# Patient Record
Sex: Male | Born: 1960 | Hispanic: No | Marital: Single | Smoking: Current every day smoker
Health system: Southern US, Community
[De-identification: ages and names within clinical notes are randomized; demographics above are authoritative.]

## PROBLEM LIST (undated history)

## (undated) DIAGNOSIS — C801 Malignant (primary) neoplasm, unspecified: Secondary | ICD-10-CM

## (undated) DIAGNOSIS — Z8673 Personal history of transient ischemic attack (TIA), and cerebral infarction without residual deficits: Secondary | ICD-10-CM

## (undated) DIAGNOSIS — K7460 Unspecified cirrhosis of liver: Secondary | ICD-10-CM

## (undated) DIAGNOSIS — R0600 Dyspnea, unspecified: Secondary | ICD-10-CM

## (undated) HISTORY — PX: OTHER SURGICAL HISTORY: SHX169

---
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
## 2004-11-12 ENCOUNTER — Emergency Department: Payer: Self-pay | Admitting: Emergency Medicine

## 2006-12-28 ENCOUNTER — Emergency Department: Payer: Self-pay | Admitting: Emergency Medicine

## 2010-02-23 ENCOUNTER — Emergency Department: Payer: Self-pay | Admitting: Emergency Medicine

## 2016-06-12 DIAGNOSIS — Z8673 Personal history of transient ischemic attack (TIA), and cerebral infarction without residual deficits: Secondary | ICD-10-CM

## 2016-06-12 HISTORY — DX: Personal history of transient ischemic attack (TIA), and cerebral infarction without residual deficits: Z86.73

## 2018-09-25 ENCOUNTER — Encounter: Payer: Self-pay | Admitting: Emergency Medicine

## 2018-09-25 ENCOUNTER — Emergency Department: Payer: Medicaid Other

## 2018-09-25 ENCOUNTER — Other Ambulatory Visit: Payer: Self-pay

## 2018-09-25 ENCOUNTER — Inpatient Hospital Stay
Admission: EM | Admit: 2018-09-25 | Discharge: 2018-09-26 | DRG: 434 | Disposition: A | Discharge status: Home / Self Care | Payer: Medicaid Other | Attending: Internal Medicine | Admitting: Internal Medicine

## 2018-09-25 DIAGNOSIS — Z1159 Encounter for screening for other viral diseases: Secondary | ICD-10-CM

## 2018-09-25 DIAGNOSIS — R140 Abdominal distension (gaseous): Secondary | ICD-10-CM

## 2018-09-25 DIAGNOSIS — F17210 Nicotine dependence, cigarettes, uncomplicated: Secondary | ICD-10-CM | POA: Diagnosis present

## 2018-09-25 DIAGNOSIS — R188 Other ascites: Secondary | ICD-10-CM | POA: Diagnosis present

## 2018-09-25 DIAGNOSIS — K7031 Alcoholic cirrhosis of liver with ascites: Principal | ICD-10-CM | POA: Diagnosis present

## 2018-09-25 DIAGNOSIS — F1010 Alcohol abuse, uncomplicated: Secondary | ICD-10-CM | POA: Diagnosis present

## 2018-09-25 DIAGNOSIS — K7460 Unspecified cirrhosis of liver: Secondary | ICD-10-CM

## 2018-09-25 LAB — COMPREHENSIVE METABOLIC PANEL
ALT: 42 U/L (ref 0–44)
AST: 73 U/L — ABNORMAL HIGH (ref 15–41)
Albumin: 2.8 g/dL — ABNORMAL LOW (ref 3.5–5.0)
Alkaline Phosphatase: 141 U/L — ABNORMAL HIGH (ref 38–126)
Anion gap: 9 (ref 5–15)
BUN: 11 mg/dL (ref 6–20)
CO2: 22 mmol/L (ref 22–32)
Calcium: 8.4 mg/dL — ABNORMAL LOW (ref 8.9–10.3)
Chloride: 105 mmol/L (ref 98–111)
Creatinine, Ser: 0.65 mg/dL (ref 0.61–1.24)
GFR calc Af Amer: >60 mL/min (ref >60)
GFR calc non Af Amer: >60 mL/min (ref >60)
Glucose, Bld: 138 mg/dL — ABNORMAL HIGH (ref 70–99)
Potassium: 3.9 mmol/L (ref 3.5–5.1)
Sodium: 136 mmol/L (ref 135–145)
Total Bilirubin: 1.4 mg/dL — ABNORMAL HIGH (ref 0.3–1.2)
Total Protein: 6.9 g/dL (ref 6.5–8.1)

## 2018-09-25 LAB — URINALYSIS, COMPLETE (UACMP) WITH MICROSCOPIC
Bacteria, UA: NONE SEEN
Bilirubin Urine: NEGATIVE
Glucose, UA: NEGATIVE mg/dL
Hgb urine dipstick: NEGATIVE
Ketones, ur: NEGATIVE mg/dL
Leukocytes,Ua: NEGATIVE
Nitrite: NEGATIVE
Protein, ur: NEGATIVE mg/dL
Specific Gravity, Urine: 1.04 — ABNORMAL HIGH (ref 1.005–1.030)
Squamous Epithelial / HPF: NONE SEEN (ref 0–5)
pH: 5 (ref 5.0–8.0)

## 2018-09-25 LAB — CBC
HCT: 39.4 % (ref 39.0–52.0)
Hemoglobin: 13.5 g/dL (ref 13.0–17.0)
MCH: 33.1 pg (ref 26.0–34.0)
MCHC: 34.3 g/dL (ref 30.0–36.0)
MCV: 96.6 fL (ref 80.0–100.0)
Platelets: 120 10*3/uL — ABNORMAL LOW (ref 150–400)
RBC: 4.08 MIL/uL — ABNORMAL LOW (ref 4.22–5.81)
RDW: 13 % (ref 11.5–15.5)
WBC: 6.4 10*3/uL (ref 4.0–10.5)
nRBC: 0 % (ref 0.0–0.2)

## 2018-09-25 LAB — TROPONIN I (HIGH SENSITIVITY): Troponin I (High Sensitivity): 3 ng/L (ref <18)

## 2018-09-25 LAB — LIPASE, BLOOD: Lipase: 43 U/L (ref 11–51)

## 2018-09-25 LAB — BRAIN NATRIURETIC PEPTIDE: B Natriuretic Peptide: 57 pg/mL (ref 0.0–100.0)

## 2018-09-25 MED ORDER — IOHEXOL 240 MG/ML SOLN
50.0000 mL | Freq: Once | INTRAMUSCULAR | Status: AC
  Administered 2018-09-25: 50 mL via ORAL

## 2018-09-25 MED ORDER — IOHEXOL 300 MG/ML  SOLN
100.0000 mL | Freq: Once | INTRAMUSCULAR | Status: AC | PRN
  Administered 2018-09-25: 100 mL via INTRAVENOUS

## 2018-09-25 NOTE — ED Provider Notes (Addendum)
[facility] Emergency Department Provider Note   ____________________________________________   First MD Initiated Contact with Patient 09/25/18 2209     (approximate)  I have reviewed the triage vital signs and the nursing notes.   HISTORY  Chief Complaint Abdominal Pain    HPI David Brown is a 58 y.o. male who complains of abdominal tightness.  He denies any chest pain or shortness of breath.  He lays down he says and get short of breath.  He cannot lay down he is been sleeping sitting up with his feet propped up.  Symptoms have been going on for 6 weeks started rapidly and got worse.  He is not running any fever.  His not having any nausea vomiting or diarrhea although occasionally has a lot of loose stools.  The stools are mostly just mushy but not runny.         History reviewed. No pertinent past medical history.  There are no active problems to display for this patient.   History reviewed. No pertinent surgical history.  Prior to Admission medications   Not on File    Allergies Patient has no allergy information on record.  No family history on file.  Social History Social History   Tobacco Use   Smoking status: Current Every Day Smoker   Smokeless tobacco: Never Used  Substance Use Topics   Alcohol use: Yes   Drug use: Not on file    Review of Systems  Constitutional: No fever/chills although he has a temperature of 99 in the ER Eyes: No visual changes. ENT: No sore throat. Cardiovascular: Denies chest pain. Respiratory: Denies shortness of breath sitting up. Gastrointestinal: See HPI Genitourinary: Negative for dysuria. Musculoskeletal: Negative for back pain. Skin: Negative for rash. Neurological: Negative for headaches, focal weakness   ____________________________________________   PHYSICAL EXAM:  VITAL SIGNS: ED Triage Vitals  Enc Vitals Group     BP 09/25/18 1613 115/82     Pulse Rate 09/25/18  1613 (!) 104     Resp 09/25/18 1613 16     Temp 09/25/18 1613 99 F (37.2 C)     Temp Source 09/25/18 1613 Oral     SpO2 09/25/18 1613 97 %     Weight 09/25/18 1614 165 lb (74.8 kg)     Height 09/25/18 1614 5\' 6"  (1.676 m)     Head Circumference --      Peak Flow --      Pain Score 09/25/18 1618 8     Pain Loc --      Pain Edu? --      Excl. in [place]? --     Constitutional: Alert and oriented. Well appearing and in no acute distress. Eyes: Conjunctivae are normal. Head: Atraumatic. Nose: No congestion/rhinnorhea. Mouth/Throat: Mucous membranes are moist.  Oropharynx non-erythematous. Neck: No stridor.  Cardiovascular: Normal rate, regular rhythm. Grossly normal heart sounds.  Good peripheral circulation. Respiratory: Normal respiratory effort.  No retractions. Lungs CTAB. Gastrointestinal: Firm distended but not really tender.. No abdominal bruits. No CVA tenderness. Musculoskeletal: No lower extremity tenderness  Neurologic:  Normal speech and language. No gross focal neurologic deficits are appreciated.  Skin:  Skin is warm, dry and intact. No rash noted. Psychiatric: Mood and affect are normal. Speech and behavior are normal.  ____________________________________________   LABS (all labs ordered are listed, but only abnormal results are displayed)  Labs Reviewed  COMPREHENSIVE METABOLIC PANEL - Abnormal; Notable for the following components:  Result Value   Glucose, Bld 138 (*)    Calcium 8.4 (*)    Albumin 2.8 (*)    AST 73 (*)    Alkaline Phosphatase 141 (*)    Total Bilirubin 1.4 (*)    All other components within normal limits  CBC - Abnormal; Notable for the following components:   RBC 4.08 (*)    Platelets 120 (*)    All other components within normal limits  SARS CORONAVIRUS 2 (HOSPITAL ORDER, [place] [facility])  LIPASE, BLOOD  BRAIN NATRIURETIC PEPTIDE  TROPONIN I (HIGH SENSITIVITY)  URINALYSIS, COMPLETE (UACMP) WITH MICROSCOPIC    ____________________________________________  EKG   ____________________________________________  RADIOLOGY  ED MD interpretation: Rest x-ray read by radiology reviewed by me shows no active disease CT read by radiology reviewed by me shows cirrhosis and ascites. Official radiology report(s): Dg Chest 2 View  Result Date: 09/25/2018 CLINICAL DATA:  Shortness of breath EXAM: CHEST - 2 VIEW COMPARISON:  02/23/2010 FINDINGS: The heart size and mediastinal contours are within normal limits. Both lungs are clear. The visualized skeletal structures are unremarkable. IMPRESSION: No active cardiopulmonary disease. Electronically Signed   By: Inez Catalina M.D.   On: 09/25/2018 22:49   Ct Abdomen Pelvis W Contrast  Result Date: 09/25/2018 CLINICAL DATA:  Abdominal distension EXAM: CT ABDOMEN AND PELVIS WITH CONTRAST TECHNIQUE: Multidetector CT imaging of the abdomen and pelvis was performed using the standard protocol following bolus administration of intravenous contrast. CONTRAST:  146mL OMNIPAQUE IOHEXOL 300 MG/ML  SOLN COMPARISON:  None. FINDINGS: Lower chest: No acute abnormality. Hepatobiliary: Shrunken liver with nodularity consistent with underlying cirrhosis. The gallbladder is within normal limits. Pancreas: Unremarkable. No pancreatic ductal dilatation or surrounding inflammatory changes. Spleen: Normal in size without focal abnormality. Adrenals/Urinary Tract: Adrenal glands are unremarkable. Kidneys are normal, without renal calculi, focal lesion, or hydronephrosis. Bladder is unremarkable. Stomach/Bowel: The appendix is not well visualized although no inflammatory changes are identified to suggest appendicitis. Scattered large and small bowel gas is seen. No obstructive or inflammatory changes noted. Stomach is within normal limits. Vascular/Lymphatic: Aortic atherosclerosis. No enlarged abdominal or pelvic lymph nodes. Reproductive: Prostate is unremarkable. Other: Large volume ascites is  identified consistent with the underlying cirrhosis. Musculoskeletal: Degenerative changes of lumbar spine are noted. IMPRESSION: Changes consistent with underlying cirrhosis with large volume ascites. No other focal abnormality is noted. Electronically Signed   By: Inez Catalina M.D.   On: 09/25/2018 23:28    ____________________________________________   PROCEDURES  Procedure(s) performed (including Critical Care):  Procedures   ____________________________________________   INITIAL IMPRESSION / ASSESSMENT AND PLAN / ED COURSE  Patient is an a good deal of discomfort and does get short of breath when he lays down.  We will see if we can get this gentleman in the hospital on drained some of the ascites tomorrow morning.   David Brown was evaluated in Emergency Department on 09/25/2018 for the symptoms described in the history of present illness. He was evaluated in the context of the global COVID-19 pandemic, which necessitated consideration that the patient might be at risk for infection with the SARS-CoV-2 virus that causes COVID-19. Institutional protocols and algorithms that pertain to the evaluation of patients at risk for COVID-19 are in a state of rapid change based on information released by regulatory bodies including the CDC and federal and state organizations. These policies and algorithms were followed during the patient's care in the ED.  ____________________________________________   FINAL CLINICAL IMPRESSION(S) / ED DIAGNOSES  Final diagnoses:  Abdominal distention  Cirrhosis of liver with ascites, unspecified hepatic cirrhosis type [facility])     ED Discharge Orders    None       Note:  This document was prepared using Dragon voice recognition software and may include unintentional dictation errors.    Nena Polio, MD 09/25/18 2300    Nena Polio, MD 09/25/18 [phone]

## 2018-09-25 NOTE — ED Notes (Signed)
Patient transported to CT 

## 2018-09-25 NOTE — ED Triage Notes (Signed)
Pt arrives with complaints of abdominal pain that pt reports as a tightness. Pt denies current chest pain/sob. Pt does report when he lays flat back he becomes short of breath. Pt's abdomen distended and taut. Pt denies new symptoms; stating they have been present for 6 weeks "it is just getting worse."

## 2018-09-25 NOTE — ED Triage Notes (Signed)
First Nurse Note:  EMS called to home for c/o SOB.  EMS reports that abdomen has become distended over the past 6 weeks.  VS wnl.

## 2018-09-26 ENCOUNTER — Inpatient Hospital Stay: Payer: Medicaid Other

## 2018-09-26 DIAGNOSIS — R140 Abdominal distension (gaseous): Secondary | ICD-10-CM | POA: Diagnosis present

## 2018-09-26 DIAGNOSIS — R188 Other ascites: Secondary | ICD-10-CM | POA: Diagnosis present

## 2018-09-26 DIAGNOSIS — F17210 Nicotine dependence, cigarettes, uncomplicated: Secondary | ICD-10-CM | POA: Diagnosis present

## 2018-09-26 DIAGNOSIS — Z1159 Encounter for screening for other viral diseases: Secondary | ICD-10-CM | POA: Diagnosis not present

## 2018-09-26 DIAGNOSIS — K7460 Unspecified cirrhosis of liver: Secondary | ICD-10-CM

## 2018-09-26 DIAGNOSIS — K7011 Alcoholic hepatitis with ascites: Secondary | ICD-10-CM

## 2018-09-26 DIAGNOSIS — F1010 Alcohol abuse, uncomplicated: Secondary | ICD-10-CM | POA: Diagnosis present

## 2018-09-26 DIAGNOSIS — K7031 Alcoholic cirrhosis of liver with ascites: Secondary | ICD-10-CM | POA: Diagnosis present

## 2018-09-26 LAB — BODY FLUID CELL COUNT WITH DIFFERENTIAL
Lymphs, Fluid: 31 %
Monocyte-Macrophage-Serous Fluid: 63 %
Neutrophil Count, Fluid: 6 %
RBC Count-Peritoneal Lavage: 144 /mm3
Total Nucleated Cell Count, Fluid: 306 cu mm

## 2018-09-26 LAB — CREATININE, SERUM
Creatinine, Ser: 0.67 mg/dL (ref 0.61–1.24)
GFR calc Af Amer: >60 mL/min (ref >60)
GFR calc non Af Amer: >60 mL/min (ref >60)

## 2018-09-26 LAB — BASIC METABOLIC PANEL
Anion gap: 7 (ref 5–15)
BUN: 11 mg/dL (ref 6–20)
CO2: 24 mmol/L (ref 22–32)
Calcium: 7.9 mg/dL — ABNORMAL LOW (ref 8.9–10.3)
Chloride: 103 mmol/L (ref 98–111)
Creatinine, Ser: 0.62 mg/dL (ref 0.61–1.24)
GFR calc Af Amer: >60 mL/min (ref >60)
GFR calc non Af Amer: >60 mL/min (ref >60)
Glucose, Bld: 93 mg/dL (ref 70–99)
Potassium: 3.6 mmol/L (ref 3.5–5.1)
Sodium: 134 mmol/L — ABNORMAL LOW (ref 135–145)

## 2018-09-26 LAB — PROTIME-INR
INR: 1.6 — ABNORMAL HIGH (ref 0.8–1.2)
Prothrombin Time: 18.9 seconds — ABNORMAL HIGH (ref 11.4–15.2)

## 2018-09-26 LAB — BRAIN NATRIURETIC PEPTIDE: B Natriuretic Peptide: 37 pg/mL (ref 0.0–100.0)

## 2018-09-26 LAB — CBC
HCT: 39.2 % (ref 39.0–52.0)
HCT: 36.3 % — ABNORMAL LOW (ref 39.0–52.0)
Hemoglobin: 13.6 g/dL (ref 13.0–17.0)
Hemoglobin: 12.5 g/dL — ABNORMAL LOW (ref 13.0–17.0)
MCH: 33.3 pg (ref 26.0–34.0)
MCH: 33.5 pg (ref 26.0–34.0)
MCHC: 34.7 g/dL (ref 30.0–36.0)
MCHC: 34.4 g/dL (ref 30.0–36.0)
MCV: 95.8 fL (ref 80.0–100.0)
MCV: 97.3 fL (ref 80.0–100.0)
Platelets: 119 10*3/uL — ABNORMAL LOW (ref 150–400)
Platelets: 102 10*3/uL — ABNORMAL LOW (ref 150–400)
RBC: 4.09 MIL/uL — ABNORMAL LOW (ref 4.22–5.81)
RBC: 3.73 MIL/uL — ABNORMAL LOW (ref 4.22–5.81)
RDW: 13 % (ref 11.5–15.5)
RDW: 13 % (ref 11.5–15.5)
WBC: 8.9 10*3/uL (ref 4.0–10.5)
WBC: 7.2 10*3/uL (ref 4.0–10.5)
nRBC: 0 % (ref 0.0–0.2)
nRBC: 0 % (ref 0.0–0.2)

## 2018-09-26 LAB — SURGICAL PCR SCREEN
MRSA, PCR: NEGATIVE
Staphylococcus aureus: NEGATIVE

## 2018-09-26 LAB — PROTEIN, PLEURAL OR PERITONEAL FLUID: Total protein, fluid: <3 g/dL

## 2018-09-26 LAB — ALBUMIN, PLEURAL OR PERITONEAL FLUID: Albumin, Fluid: <1 g/dL

## 2018-09-26 LAB — BILIRUBIN, TOTAL: Total Bilirubin: 2.7 mg/dL — ABNORMAL HIGH (ref 0.3–1.2)

## 2018-09-26 LAB — LACTATE DEHYDROGENASE, PLEURAL OR PERITONEAL FLUID: LD, Fluid: 50 U/L — ABNORMAL HIGH (ref 3–23)

## 2018-09-26 LAB — APTT: aPTT: 36 seconds (ref 24–36)

## 2018-09-26 LAB — SARS CORONAVIRUS 2 BY RT PCR (HOSPITAL ORDER, PERFORMED IN ~~LOC~~ HOSPITAL LAB): SARS Coronavirus 2: NEGATIVE

## 2018-09-26 MED ORDER — THIAMINE HCL 100 MG/ML IJ SOLN: 100.0000 mg | Freq: Every day | INTRAMUSCULAR | Status: DC

## 2018-09-26 MED ORDER — ADULT MULTIVITAMIN W/MINERALS CH: 1.0000 | ORAL_TABLET | Freq: Every day | ORAL | 0 refills | Status: DC

## 2018-09-26 MED ORDER — ENOXAPARIN SODIUM 40 MG/0.4ML ~~LOC~~ SOLN
40.0000 mg | SUBCUTANEOUS | Status: DC
  Administered 2018-09-26: 40 mg via SUBCUTANEOUS
  Filled 2018-09-26: qty 0.4

## 2018-09-26 MED ORDER — MUPIROCIN 2 % EX OINT
1.0000 "application " | TOPICAL_OINTMENT | Freq: Two times a day (BID) | CUTANEOUS | Status: DC
  Filled 2018-09-26: qty 22

## 2018-09-26 MED ORDER — ADULT MULTIVITAMIN W/MINERALS CH: 1.0000 | ORAL_TABLET | Freq: Every day | ORAL | Status: DC

## 2018-09-26 MED ORDER — FUROSEMIDE 20 MG PO TABS
20.0000 mg | ORAL_TABLET | Freq: Every day | ORAL | Status: DC
  Administered 2018-09-26: 20 mg via ORAL
  Filled 2018-09-26: qty 1

## 2018-09-26 MED ORDER — SODIUM CHLORIDE 0.9 % IV SOLN: 250.0000 mL | INTRAVENOUS | Status: DC | PRN

## 2018-09-26 MED ORDER — VITAMIN B-1 100 MG PO TABS
100.0000 mg | ORAL_TABLET | Freq: Every day | ORAL | Status: DC
  Administered 2018-09-26: 100 mg via ORAL
  Filled 2018-09-26: qty 1

## 2018-09-26 MED ORDER — SODIUM CHLORIDE 0.9% FLUSH
3.0000 mL | Freq: Two times a day (BID) | INTRAVENOUS | Status: DC
  Administered 2018-09-26: 3 mL via INTRAVENOUS

## 2018-09-26 MED ORDER — PANTOPRAZOLE SODIUM 40 MG PO TBEC: 40.0000 mg | DELAYED_RELEASE_TABLET | Freq: Every day | ORAL | Status: DC

## 2018-09-26 MED ORDER — LORAZEPAM 2 MG/ML IJ SOLN: 1.0000 mg | Freq: Four times a day (QID) | INTRAMUSCULAR | Status: DC | PRN

## 2018-09-26 MED ORDER — ONDANSETRON HCL 4 MG PO TABS: 4.0000 mg | ORAL_TABLET | Freq: Four times a day (QID) | ORAL | Status: DC | PRN

## 2018-09-26 MED ORDER — SODIUM CHLORIDE 0.9% FLUSH: 3.0000 mL | INTRAVENOUS | Status: DC | PRN

## 2018-09-26 MED ORDER — FOLIC ACID 1 MG PO TABS
1.0000 mg | ORAL_TABLET | Freq: Every day | ORAL | Status: DC
  Administered 2018-09-26: 1 mg via ORAL
  Filled 2018-09-26: qty 1

## 2018-09-26 MED ORDER — PANTOPRAZOLE SODIUM 40 MG IV SOLR
40.0000 mg | INTRAVENOUS | Status: DC
  Administered 2018-09-26: 03:00:00 40 mg via INTRAVENOUS
  Filled 2018-09-26: qty 40

## 2018-09-26 MED ORDER — LORAZEPAM 1 MG PO TABS: 1.0000 mg | ORAL_TABLET | Freq: Four times a day (QID) | ORAL | Status: DC | PRN

## 2018-09-26 MED ORDER — ENOXAPARIN SODIUM 40 MG/0.4ML ~~LOC~~ SOLN: 40.0000 mg | SUBCUTANEOUS | Status: DC

## 2018-09-26 MED ORDER — ADULT MULTIVITAMIN W/MINERALS CH
1.0000 | ORAL_TABLET | Freq: Every day | ORAL | Status: DC
  Administered 2018-09-26: 1 via ORAL
  Filled 2018-09-26: qty 1

## 2018-09-26 MED ORDER — SPIRONOLACTONE 25 MG PO TABS
25.0000 mg | ORAL_TABLET | Freq: Every day | ORAL | Status: DC
  Administered 2018-09-26: 25 mg via ORAL
  Filled 2018-09-26: qty 1

## 2018-09-26 MED ORDER — ONDANSETRON HCL 4 MG/2ML IJ SOLN: 4.0000 mg | Freq: Four times a day (QID) | INTRAMUSCULAR | Status: DC | PRN

## 2018-09-26 MED ORDER — SPIRONOLACTONE 25 MG PO TABS: 25.0000 mg | ORAL_TABLET | Freq: Every day | ORAL | 0 refills | Status: DC

## 2018-09-26 MED ORDER — FUROSEMIDE 20 MG PO TABS: 20.0000 mg | ORAL_TABLET | Freq: Every day | ORAL | 0 refills | Status: DC

## 2018-09-26 MED ORDER — FOLIC ACID 5 MG/ML IJ SOLN: 1.0000 mg | Freq: Every day | INTRAMUSCULAR | Status: DC

## 2018-09-26 MED ORDER — FUROSEMIDE 40 MG PO TABS: 40.0000 mg | ORAL_TABLET | Freq: Every day | ORAL | Status: DC

## 2018-09-26 NOTE — TOC Transition Note (Signed)
Transition of Care [facility]) - CM/SW Discharge Note   Patient Details  Name: David Brown MRN: [id number] Date of Birth: [birth year]  [facility] CM/SW Contact:  Beverly Sessions, RN Phone Number: 09/26/2018, 2:33 PM   Clinical Narrative:    Patient to discharge today Patient was admitted with ascities.  Patient states that he lives at home with his brother and sister in law.  Sister will be picking him up for discharge.  Patient states that he relies on family to provide transportation.  Denies any issues with transportation.   Patient states that he does not have a PCP.  "Im never sick, what do I need one for".  RNCM explained the importance of having a provider.  RNCM provided application to [facility] .   Patient to be discharge with Lasix and aldactone.  Medications to be filled at [facility]  No cost to patient, will be delivered to patient at bedside.       Final next level of care: Home/Self Care     Patient Goals and CMS Choice        Discharge Placement                       Discharge Plan and Services   Discharge Planning Services: CM Consult, Medication Assistance                                 Social Determinants of Health (SDOH) Interventions     Readmission Risk Interventions No flowsheet data found.

## 2018-09-26 NOTE — ED Notes (Signed)
ED TO INPATIENT HANDOFF REPORT  ED Nurse Name and Phone #:  Berenda Morale RN [phone]  S Name/Age/Gender David Brown 58 y.o. male Room/Bed: ED24A/ED24A  Code Status   Code Status: Full Code  Home/SNF/Other Home Patient oriented to: self, place, time and situation Is this baseline? Yes   Triage Complete: Triage complete  Chief Complaint Abd Pain  EMS  Triage Note First Nurse Note:  EMS called to home for c/o SOB.  EMS reports that abdomen has become distended over the past 6 weeks.  VS wnl.  Pt arrives with complaints of abdominal pain that pt reports as a tightness. Pt denies current chest pain/sob. Pt does report when he lays flat back he becomes short of breath. Pt's abdomen distended and taut. Pt denies new symptoms; stating they have been present for 6 weeks "it is just getting worse."   Allergies No Known Allergies  Level of Care/Admitting Diagnosis ED Disposition    ED Disposition Condition [place]: [place] [100120]  Level of Care: Med-Surg [16]  Covid Evaluation: Confirmed COVID Negative  Diagnosis: Ascites [743709]  Admitting Physician: Mayer Camel [[id number]]  Attending Physician: Mayer Camel [[id number]]  Estimated length of stay: past midnight tomorrow  Certification:: I certify this patient will need inpatient services for at least 2 midnights  PT Class (Do Not Modify): Inpatient [101]  PT Acc Code (Do Not Modify): Private [1]       B Medical/Surgery History History reviewed. No pertinent past medical history. History reviewed. No pertinent surgical history.   A IV Location/Drains/Wounds Patient Lines/Drains/Airways Status   Active Line/Drains/Airways    Name:   Placement date:   Placement time:   Site:   Days:   Peripheral IV 09/25/18 Left Antecubital   09/25/18    1625    Antecubital   1          Intake/Output Last 24 hours No intake or output data in the 24 hours ending  09/26/18 0110  Labs/Imaging Results for orders placed or performed during the hospital encounter of 09/25/18 (from the past 48 hour(s))  Comprehensive metabolic panel     Status: Abnormal   Collection Time: 09/25/18  4:19 PM  Result Value Ref Range   Sodium 136 135 - 145 mmol/L   Potassium 3.9 3.5 - 5.1 mmol/L   Chloride 105 98 - 111 mmol/L   CO2 22 22 - 32 mmol/L   Glucose, Bld 138 (H) 70 - 99 mg/dL   BUN 11 6 - 20 mg/dL   Creatinine, Ser 0.65 0.61 - 1.24 mg/dL   Calcium 8.4 (L) 8.9 - 10.3 mg/dL   Total Protein 6.9 6.5 - 8.1 g/dL   Albumin 2.8 (L) 3.5 - 5.0 g/dL   AST 73 (H) 15 - 41 U/L   ALT 42 0 - 44 U/L   Alkaline Phosphatase 141 (H) 38 - 126 U/L   Total Bilirubin 1.4 (H) 0.3 - 1.2 mg/dL   GFR calc non Af Amer >60 >60 mL/min   GFR calc Af Amer >60 >60 mL/min   Anion gap 9 5 - 15    Comment: Performed at [facility], [address] [place], [place] [address]  CBC     Status: Abnormal   Collection Time: 09/25/18  4:19 PM  Result Value Ref Range   WBC 6.4 4.0 - 10.5 K/uL   RBC 4.08 (L) 4.22 - 5.81 MIL/uL   Hemoglobin 13.5 13.0 -  17.0 g/dL   HCT 39.4 39.0 - 52.0 %   MCV 96.6 80.0 - 100.0 fL   MCH 33.1 26.0 - 34.0 pg   MCHC 34.3 30.0 - 36.0 g/dL   RDW 13.0 11.5 - 15.5 %   Platelets 120 (L) 150 - 400 K/uL    Comment: Immature Platelet Fraction may be clinically indicated, consider ordering this additional test [id number]    nRBC 0.0 0.0 - 0.2 %    Comment: Performed at [facility], [address] [place], [place] [address]  Lipase, blood     Status: None   Collection Time: 09/25/18  4:19 PM  Result Value Ref Range   Lipase 43 11 - 51 U/L    Comment: Performed at [facility], [address] [place], [place] [address]  Brain natriuretic peptide     Status: None   Collection Time: 09/25/18  4:19 PM  Result Value Ref Range   B Natriuretic Peptide 57.0 0.0 - 100.0 pg/mL    Comment: Performed at [facility], [place], [place] [address]  Troponin I (High Sensitivity)     Status: None   Collection Time: 09/25/18  4:19 PM  Result Value Ref Range   Troponin I (High Sensitivity) 3 <18 ng/L    Comment: (NOTE) Elevated high sensitivity troponin I (hsTnI) values and significant  changes across serial measurements may suggest ACS but many other  chronic and acute conditions are known to elevate hsTnI results.  Refer to the "Links" section for chest pain algorithms and additional  guidance. Performed at [facility], [address] [place], [place] [address]   Urinalysis, Complete w Microscopic     Status: Abnormal   Collection Time: 09/25/18 11:32 PM  Result Value Ref Range   Color, Urine AMBER (A) YELLOW    Comment: BIOCHEMICALS MAY BE AFFECTED BY COLOR   APPearance HAZY (A) CLEAR   Specific Gravity, Urine 1.040 (H) 1.005 - 1.030   pH 5.0 5.0 - 8.0   Glucose, UA NEGATIVE NEGATIVE mg/dL   Hgb urine dipstick NEGATIVE NEGATIVE   Bilirubin Urine NEGATIVE NEGATIVE   Ketones, ur NEGATIVE NEGATIVE mg/dL   Protein, ur NEGATIVE NEGATIVE mg/dL   Nitrite NEGATIVE NEGATIVE   Leukocytes,Ua NEGATIVE NEGATIVE   WBC, UA 0-5 0 - 5 WBC/hpf   Bacteria, UA NONE SEEN NONE SEEN   Squamous Epithelial / LPF NONE SEEN 0 - 5   Mucus PRESENT     Comment: Performed at [facility], [address] [place], [place] [address]   Dg Chest 2 View  Result Date: 09/25/2018 CLINICAL DATA:  Shortness of breath EXAM: CHEST - 2 VIEW COMPARISON:  02/23/2010 FINDINGS: The heart size and mediastinal contours are within normal limits. Both lungs are clear. The visualized skeletal structures are unremarkable. IMPRESSION: No active cardiopulmonary disease. Electronically Signed   By: Inez Catalina M.D.   On: 09/25/2018 22:49   Ct Abdomen Pelvis W Contrast  Result Date: 09/25/2018 CLINICAL DATA:  Abdominal distension EXAM: CT ABDOMEN AND PELVIS WITH CONTRAST TECHNIQUE: Multidetector CT imaging of the abdomen and  pelvis was performed using the standard protocol following bolus administration of intravenous contrast. CONTRAST:  163mL OMNIPAQUE IOHEXOL 300 MG/ML  SOLN COMPARISON:  None. FINDINGS: Lower chest: No acute abnormality. Hepatobiliary: Shrunken liver with nodularity consistent with underlying cirrhosis. The gallbladder is within normal limits. Pancreas: Unremarkable. No pancreatic ductal dilatation or surrounding inflammatory changes. Spleen: Normal in size without focal abnormality.  Adrenals/Urinary Tract: Adrenal glands are unremarkable. Kidneys are normal, without renal calculi, focal lesion, or hydronephrosis. Bladder is unremarkable. Stomach/Bowel: The appendix is not well visualized although no inflammatory changes are identified to suggest appendicitis. Scattered large and small bowel gas is seen. No obstructive or inflammatory changes noted. Stomach is within normal limits. Vascular/Lymphatic: Aortic atherosclerosis. No enlarged abdominal or pelvic lymph nodes. Reproductive: Prostate is unremarkable. Other: Large volume ascites is identified consistent with the underlying cirrhosis. Musculoskeletal: Degenerative changes of lumbar spine are noted. IMPRESSION: Changes consistent with underlying cirrhosis with large volume ascites. No other focal abnormality is noted. Electronically Signed   By: Inez Catalina M.D.   On: 09/25/2018 23:28    Pending Labs Unresulted Labs (From admission, onward)    Start     Ordered   10/03/18 0500  Creatinine, serum  (enoxaparin (LOVENOX)    CrCl >/= 30 ml/min)  Weekly,   STAT    Comments: while on enoxaparin therapy    09/26/18 0055   09/26/18 1740  Basic metabolic panel  Tomorrow morning,   STAT     09/26/18 0055   09/26/18 0500  CBC  Tomorrow morning,   STAT     09/26/18 0055   09/26/18 0500  Protime-INR  Tomorrow morning,   STAT     09/26/18 0056   09/26/18 0500  APTT  Tomorrow morning,   STAT     09/26/18 0055   09/26/18 0056  HIV antibody (Routine Testing)   Once,   STAT     09/26/18 0055   09/26/18 0056  CBC  (enoxaparin (LOVENOX)    CrCl >/= 30 ml/min)  Once,   STAT    Comments: Baseline for enoxaparin therapy IF NOT ALREADY DRAWN.  Notify MD if PLT < 100 K.    09/26/18 0055   09/26/18 0056  Creatinine, serum  (enoxaparin (LOVENOX)    CrCl >/= 30 ml/min)  Once,   STAT    Comments: Baseline for enoxaparin therapy IF NOT ALREADY DRAWN.    09/26/18 0055   09/26/18 0056  Brain natriuretic peptide  Once,   STAT     09/26/18 0055   09/26/18 0056  Hepatitis panel, acute  Once,   STAT     09/26/18 0055   09/25/18 2345  SARS Coronavirus 2 (CEPHEID - Performed in [place] hospital lab), Hosp Order  (Asymptomatic Patients Labs)  Once,   STAT    Question:  Rule Out  Answer:  Yes   09/25/18 2344          Vitals/Pain Today's Vitals   09/25/18 1614 09/25/18 1618 09/25/18 2239 09/26/18 0000  BP:   (!) 154/102 (!) 126/93  Pulse:   99 98  Resp:   16 18  Temp:      TempSrc:      SpO2:   99% 93%  Weight: 74.8 kg     Height: 5\' 6"  (1.676 m)     PainSc:  8       Isolation Precautions No active isolations  Medications Medications  enoxaparin (LOVENOX) injection 40 mg (has no administration in time range)  sodium chloride flush (NS) 0.9 % injection 3 mL (has no administration in time range)  sodium chloride flush (NS) 0.9 % injection 3 mL (has no administration in time range)  sodium chloride flush (NS) 0.9 % injection 3 mL (has no administration in time range)  0.9 %  sodium chloride infusion (has no administration in time range)  ondansetron (  ZOFRAN) tablet 4 mg (has no administration in time range)    Or  ondansetron (ZOFRAN) injection 4 mg (has no administration in time range)  LORazepam (ATIVAN) tablet 1 mg (has no administration in time range)    Or  LORazepam (ATIVAN) injection 1 mg (has no administration in time range)  thiamine (VITAMIN B-1) tablet 100 mg (has no administration in time range)    Or  thiamine (B-1) injection  100 mg (has no administration in time range)  folic acid (FOLVITE) tablet 1 mg (has no administration in time range)  multivitamin with minerals tablet 1 tablet (has no administration in time range)  iohexol (OMNIPAQUE) 240 MG/ML injection 50 mL (50 mLs Oral Contrast Given 09/25/18 2245)  iohexol (OMNIPAQUE) 300 MG/ML solution 100 mL (100 mLs Intravenous Contrast Given 09/25/18 2313)    Mobility walks Low fall risk   Focused Assessments Pulmonary Assessment Handoff:  Lung sounds:   O2 Device: Room Air        R Recommendations: See Admitting Provider Note  Report given to:   Additional Notes:  Pt newly diagnosed with liver problems, has hx of etoh use.

## 2018-09-26 NOTE — Progress Notes (Signed)
Discharge order received. Patient mental status is at baseline. Vital signs stable . No signs of acute distress. Discharge instructions given. Patient verbalized understanding. No other issues noted at this time.   

## 2018-09-26 NOTE — Discharge Instructions (Signed)
Stop drinking

## 2018-09-26 NOTE — H&P (Signed)
[place] at [place] NAME: David Brown    MR#:  [id number]  DATE OF BIRTH:  Aug 15, 1960  DATE OF ADMISSION:  09/25/2018  PRIMARY CARE PHYSICIAN: No primary care provider on file.   REQUESTING/REFERRING PHYSICIAN: Conni Slipper, MD  CHIEF COMPLAINT:   Chief Complaint  Patient presents with  . Abdominal Pain    HISTORY OF PRESENT ILLNESS:  David Brown  is a 58 y.o. male with a known history of alcohol and tobacco abuse drinking 6-8 beers on most nights.  He also has a history of smoking 1 to 2 packs cigarettes per day with an 80-pack-year history of tobacco abuse.  Who presented to the emergency room complaining of shortness of breath when lying flat and abdominal distention and tightness.  He has noted the symptoms progressing over the last 6 weeks.  Patient now reports he is unable to sleep in the bed as he becomes severely short of breath.  He denies having had prior evaluation for complaints.  He denies chest pain.  He denies fevers or chills.  He denies nausea or vomiting.  He has noted loose stools however infrequent.  He denies a history of IV drug use.  He has no prior history of cirrhosis.  However on abdominal CT performed in the emergency room, there are changes consistent with underlying cirrhosis and large volume ascites.  There is no evidence of pancreatic duct dilatation or surrounding inflammatory changes.  The gallbladder is within normal limits.  Labs demonstrate bilirubin of 1.4, AST of 73, alkaline phosphatase of 141, and decreased platelet count 120.  He has been admitted to the hospitalist service for further management.  PAST MEDICAL HISTORY:  History reviewed. No pertinent past medical history.  PAST SURGICAL HISTORY:  History reviewed. No pertinent surgical history.  SOCIAL HISTORY:   Social History   Tobacco Use  . Smoking status: Current Every Day Smoker  . Smokeless tobacco: Never Used  Substance Use  Topics  . Alcohol use: Yes    FAMILY HISTORY:  No family history on file.  DRUG ALLERGIES:  No Known Allergies  REVIEW OF SYSTEMS:   Review of Systems  Constitutional: Negative for chills, diaphoresis, fever and malaise/fatigue.  HENT: Negative for congestion and sore throat.   Eyes: Negative for blurred vision and double vision.  Respiratory: Positive for shortness of breath. Negative for cough, hemoptysis, sputum production and wheezing.   Cardiovascular: Positive for orthopnea and leg swelling. Negative for chest pain and palpitations.  Gastrointestinal: Negative for abdominal pain, blood in stool, constipation, diarrhea, heartburn, melena, nausea and vomiting.  Genitourinary: Negative for dysuria, flank pain, frequency and hematuria.  Musculoskeletal: Negative for back pain, falls and joint pain.  Skin: Negative.  Negative for itching and rash.  Neurological: Negative for dizziness, tingling, tremors, loss of consciousness, weakness and headaches.  Psychiatric/Behavioral: Negative.  Negative for depression.      MEDICATIONS AT HOME:   Prior to Admission medications   Not on File      VITAL SIGNS:  Blood pressure (!) 126/93, pulse 98, temperature 99 F (37.2 C), temperature source Oral, resp. rate 18, height 5\' 6"  (1.676 m), weight 74.8 kg, SpO2 93 %.  PHYSICAL EXAMINATION:  Physical Exam  GENERAL:  58 y.o.-year-old patient lying in the bed with no acute distress.  EYES: Pupils equal, round, reactive to light and accommodation. scleral icterus present. Extraocular muscles intact.  HEENT: Head atraumatic, normocephalic. Oropharynx and nasopharynx clear.  NECK:  Supple, no jugular venous distention. No thyroid enlargement, no tenderness.  LUNGS: Normal breath sounds bilaterally, no wheezing, rales,rhonchi or crepitation. No use of accessory muscles of respiration.  CARDIOVASCULAR: Regular rate and rhythm, S1, S2 normal. No murmurs, rubs, or gallops.  ABDOMEN: Firm,  Distended, nontender. Bowel sounds present. No organomegaly or mass.  EXTREMITIES:1+ pitting pedal edema, cyanosis, or clubbing.  NEUROLOGIC: Cranial nerves II through XII are intact. Muscle strength 5/5 in all extremities. Sensation intact. Gait not checked.  PSYCHIATRIC: The patient is alert and oriented x 3.  Normal affect and good eye contact. SKIN: No obvious rash, lesion, or ulcer. jaundice  LABORATORY PANEL:   CBC Recent Labs  Lab 09/25/18 1619  WBC 6.4  HGB 13.5  HCT 39.4  PLT 120*   ------------------------------------------------------------------------------------------------------------------  Chemistries  Recent Labs  Lab 09/25/18 1619  NA 136  K 3.9  CL 105  CO2 22  GLUCOSE 138*  BUN 11  CREATININE 0.65  CALCIUM 8.4*  AST 73*  ALT 42  ALKPHOS 141*  BILITOT 1.4*   ------------------------------------------------------------------------------------------------------------------  Cardiac Enzymes No results for input(s): TROPONINI in the last 168 hours. ------------------------------------------------------------------------------------------------------------------  RADIOLOGY:  Dg Chest 2 View  Result Date: 09/25/2018 CLINICAL DATA:  Shortness of breath EXAM: CHEST - 2 VIEW COMPARISON:  02/23/2010 FINDINGS: The heart size and mediastinal contours are within normal limits. Both lungs are clear. The visualized skeletal structures are unremarkable. IMPRESSION: No active cardiopulmonary disease. Electronically Signed   By: Inez Catalina M.D.   On: 09/25/2018 22:49   Ct Abdomen Pelvis W Contrast  Result Date: 09/25/2018 CLINICAL DATA:  Abdominal distension EXAM: CT ABDOMEN AND PELVIS WITH CONTRAST TECHNIQUE: Multidetector CT imaging of the abdomen and pelvis was performed using the standard protocol following bolus administration of intravenous contrast. CONTRAST:  157mL OMNIPAQUE IOHEXOL 300 MG/ML  SOLN COMPARISON:  None. FINDINGS: Lower chest: No acute  abnormality. Hepatobiliary: Shrunken liver with nodularity consistent with underlying cirrhosis. The gallbladder is within normal limits. Pancreas: Unremarkable. No pancreatic ductal dilatation or surrounding inflammatory changes. Spleen: Normal in size without focal abnormality. Adrenals/Urinary Tract: Adrenal glands are unremarkable. Kidneys are normal, without renal calculi, focal lesion, or hydronephrosis. Bladder is unremarkable. Stomach/Bowel: The appendix is not well visualized although no inflammatory changes are identified to suggest appendicitis. Scattered large and small bowel gas is seen. No obstructive or inflammatory changes noted. Stomach is within normal limits. Vascular/Lymphatic: Aortic atherosclerosis. No enlarged abdominal or pelvic lymph nodes. Reproductive: Prostate is unremarkable. Other: Large volume ascites is identified consistent with the underlying cirrhosis. Musculoskeletal: Degenerative changes of lumbar spine are noted. IMPRESSION: Changes consistent with underlying cirrhosis with large volume ascites. No other focal abnormality is noted. Electronically Signed   By: Inez Catalina M.D.   On: 09/25/2018 23:28      IMPRESSION AND PLAN:   1. liver cirrhosis with ascites - Interventional radiology for ultrasound-guided paracentesis with labs included - Dr. Marius Ditch with gastroenterology consulted for further evaluation and recommendations - Hepatitis panel pending - Repeat BMP and CBC in the a.m.  Will monitor platelet count closely  2.  EtOH abuse - CIWA protocol  3.  Tobacco abuse - We have discussed the importance of tobacco cessation  DVT and PPI prophylaxis initiated    All the records are reviewed and case discussed with ED provider. The plan of care was discussed in details with the patient (and family). I answered all questions. The patient agreed to proceed with the above mentioned plan. Further management will depend  upon hospital course.   CODE STATUS:  Full code  TOTAL TIME TAKING CARE OF THIS PATIENT: 45 minutes.    [place] on 09/26/2018 at 1:13 AM  Pager - [phone]  After 6pm go to [email or web address] - [employer] [facility]  Office  [phone]  CC: Primary care physician; No primary care provider on file.   Note: This dictation was prepared with Dragon dictation along with smaller phrase technology. Any transcriptional errors that result from this process are unintentional.

## 2018-09-26 NOTE — Progress Notes (Addendum)
Best contact number to reach patient is [phone] (brother's cell number)   Pat( sister's work) [phone]   Pt. and sister David Brown was instructed to call [facility] tomorrow to set up follow up outpatient with Dr. Grayland Ormond.

## 2018-09-26 NOTE — Discharge Summary (Signed)
[place] at [place] NAME: David Brown    MR#:  [id number]  DATE OF BIRTH:  1960-12-11  DATE OF ADMISSION:  09/25/2018 ADMITTING PHYSICIAN: Christel Mormon, MD  DATE OF DISCHARGE: 09/26/2018  PRIMARY CARE PHYSICIAN: Patient, No Pcp Per    ADMISSION DIAGNOSIS:  Abdominal distention [R14.0] Ascites [R18.8] Cirrhosis of liver with ascites, unspecified hepatic cirrhosis type ([place]) [K74.60, R18.8]  DISCHARGE DIAGNOSIS:  *Ascites--new onset status post paracentesis *cirrhosis of the liver alcohol induced *abnormal CT scan-possible hepatocellular carcinoma-- patient will be followed at the cancer center with Dr. Grayland Ormond as outpatient workup  SECONDARY DIAGNOSIS:  History reviewed. No pertinent past medical history.  HOSPITAL COURSE:   David Brown  is a 58 y.o. male with a known history of alcohol and tobacco abuse drinking 6-8 beers on most nights.  He also has a history of smoking 1 to 2 packs cigarettes per day with an 80-pack-year history of tobacco abuse.  Who presented to the emergency room complaining of shortness of breath when lying flat and abdominal distention and tightness.    1. liver cirrhosis with ascites -patient is status post paracentesis removed 5.6 L of fluid. -Seen by Dr. Allen Norris G.I. recommends to start patient on low-dose Lasix and Aldactone. Okay from G.I. standpoint of discharge and follow-up as outpatient for further workup on cirrhosis of the liver -patient advised alcohol cessation. There is no history of DTs. -Patient otherwise hemodynamically stable  2.  EtOH abuse - CIWA protocol-- no signs of withdrawal  3.  Tobacco abuse - We have discussed the importance of tobacco cessation  4. Abnormal liver morphology noted on CT abdomen -per Dr. Pascal Lux radiology :Incidentally noted approximately 2.8 cm lesion within the subcapsular aspect the right lobe of the liver.   Further evaluation with the acquisition  of AFP level and abdominal MRI -messaged Dr. Grayland Ormond-- recommended to check AFP tumor marker CA 19- nine and follow-up as outpatient at the cancer center for further workup for possible hepatocellular carcinoma  -Patient has been explained of above. He voiced understanding.  D/c home CONSULTS OBTAINED:  Treatment Team:  Lin Landsman, MD [name], MD Lloyd Huger, MD  DRUG ALLERGIES:  No Known Allergies  DISCHARGE MEDICATIONS:   Allergies as of 09/26/2018   No Known Allergies     Medication List    TAKE these medications   furosemide 20 MG tablet Commonly known as: LASIX Take 1 tablet (20 mg total) by mouth daily.   multivitamin with minerals Tabs tablet Take 1 tablet by mouth daily. Start taking on: September 27, 2018   spironolactone 25 MG tablet Commonly known as: ALDACTONE Take 1 tablet (25 mg total) by mouth daily.       If you experience worsening of your admission symptoms, develop shortness of breath, life threatening emergency, suicidal or homicidal thoughts you must seek medical attention immediately by calling 911 or calling your MD immediately  if symptoms less severe.  You Must read complete instructions/literature along with all the possible adverse reactions/side effects for all the Medicines you take and that have been prescribed to you. Take any new Medicines after you have completely understood and accept all the possible adverse reactions/side effects.   Please note  You were cared for by a hospitalist during your hospital stay. If you have any questions about your discharge medications or the care you received while you were in the hospital after you are discharged, you can  call the unit and asked to speak with the hospitalist on call if the hospitalist that took care of you is not available. Once you are discharged, your primary care physician will handle any further medical issues. Please note that NO REFILLS for any discharge medications  will be authorized once you are discharged, as it is imperative that you return to your primary care physician (or establish a relationship with a primary care physician if you do not have one) for your aftercare needs so that they can reassess your need for medications and monitor your lab values. Today   SUBJECTIVE   No new complaints. Denies any anxiety tremors or shaking. Eating well.  VITAL SIGNS:  Blood pressure 127/71, pulse 83, temperature 98.4 F (36.9 C), temperature source Oral, resp. rate 20, height 5\' 6"  (1.676 m), weight 75.9 kg, SpO2 98 %.  I/O:  No intake or output data in the 24 hours ending 09/26/18 1219  PHYSICAL EXAMINATION:  GENERAL:  58 y.o.-year-old patient lying in the bed with no acute distress.  EYES: Pupils equal, round, reactive to light and accommodation. No scleral icterus. Extraocular muscles intact.  HEENT: Head atraumatic, normocephalic. Oropharynx and nasopharynx clear.  NECK:  Supple, no jugular venous distention. No thyroid enlargement, no tenderness.  LUNGS: Normal breath sounds bilaterally, no wheezing, rales,rhonchi or crepitation. No use of accessory muscles of respiration.  CARDIOVASCULAR: S1, S2 normal. No murmurs, rubs, or gallops.  ABDOMEN: Soft, non-tender, non-distended. Bowel sounds present. No organomegaly or mass.  EXTREMITIES: No pedal edema, cyanosis, or clubbing.  NEUROLOGIC: Cranial nerves II through XII are intact. Muscle strength 5/5 in all extremities. Sensation intact. Gait not checked.  PSYCHIATRIC: The patient is alert and oriented x 3.  SKIN: No obvious rash, lesion, or ulcer.   DATA REVIEW:   CBC  Recent Labs  Lab 09/26/18 0530  WBC 7.2  HGB 12.5*  HCT 36.3*  PLT 102*    Chemistries  Recent Labs  Lab 09/25/18 1619  09/26/18 0530  NA 136  --  134*  K 3.9  --  3.6  CL 105  --  103  CO2 22  --  24  GLUCOSE 138*  --  93  BUN 11  --  11  CREATININE 0.65   < > 0.62  CALCIUM 8.4*  --  7.9*  AST 73*  --   --    ALT 42  --   --   ALKPHOS 141*  --   --   BILITOT 1.4*  --  2.7*   < > = values in this interval not displayed.    Microbiology Results   Recent Results (from the past 240 hour(s))  SARS Coronavirus 2 (CEPHEID - Performed in [facility] hospital lab), Hosp Order     Status: None   Collection Time: 09/26/18 12:27 AM   Specimen: Nasopharyngeal Swab  Result Value Ref Range Status   SARS Coronavirus 2 NEGATIVE NEGATIVE Final    Comment: (NOTE) If result is NEGATIVE SARS-CoV-2 target nucleic acids are NOT DETECTED. The SARS-CoV-2 RNA is generally detectable in upper and lower  respiratory specimens during the acute phase of infection. The lowest  concentration of SARS-CoV-2 viral copies this assay can detect is 250  copies / mL. A negative result does not preclude SARS-CoV-2 infection  and should not be used as the sole basis for treatment or other  patient management decisions.  A negative result may occur with  improper specimen collection / handling, submission of  specimen other  than nasopharyngeal swab, presence of viral mutation(s) within the  areas targeted by this assay, and inadequate number of viral copies  (<250 copies / mL). A negative result must be combined with clinical  observations, patient history, and epidemiological information. If result is POSITIVE SARS-CoV-2 target nucleic acids are DETECTED. The SARS-CoV-2 RNA is generally detectable in upper and lower  respiratory specimens dur ing the acute phase of infection.  Positive  results are indicative of active infection with SARS-CoV-2.  Clinical  correlation with patient history and other diagnostic information is  necessary to determine patient infection status.  Positive results do  not rule out bacterial infection or co-infection with other viruses. If result is PRESUMPTIVE POSTIVE SARS-CoV-2 nucleic acids MAY BE PRESENT.   A presumptive positive result was obtained on the submitted specimen  and confirmed  on repeat testing.  While 2019 novel coronavirus  (SARS-CoV-2) nucleic acids may be present in the submitted sample  additional confirmatory testing may be necessary for epidemiological  and / or clinical management purposes  to differentiate between  SARS-CoV-2 and other Sarbecovirus currently known to infect humans.  If clinically indicated additional testing with an alternate test  methodology [phone]) is advised. The SARS-CoV-2 RNA is generally  detectable in upper and lower respiratory sp ecimens during the acute  phase of infection. The expected result is Negative. Fact Sheet for Patients:  StrictlyIdeas.no Fact Sheet for Healthcare Providers: BankingDealers.co.za This test is not yet approved or cleared by the [place] FDA and has been authorized for detection and/or diagnosis of SARS-CoV-2 by FDA under an Emergency Use Authorization (EUA).  This EUA will remain in effect (meaning this test can be used) for the duration of the COVID-19 declaration under Section 564(b)(1) of the Act, 21 U.S.C. section 360bbb-3(b)(1), unless the authorization is terminated or revoked sooner. Performed at [facility], [address], [place], [place] [address]   Surgical PCR screen     Status: None   Collection Time: 09/26/18  2:45 AM   Specimen: Nasal Mucosa; Nasal Swab  Result Value Ref Range Status   MRSA, PCR NEGATIVE NEGATIVE Final   Staphylococcus aureus NEGATIVE NEGATIVE Final    Comment: (NOTE) The Xpert SA Assay (FDA approved for NASAL specimens in patients 51 years of age and older), is one component of a comprehensive surveillance program. It is not intended to diagnose infection nor to guide or monitor treatment. Performed at [facility], [address] [place], [place] [address]     RADIOLOGY:  Dg Chest 2 View  Result Date: 09/25/2018 CLINICAL DATA:  Shortness of breath EXAM: CHEST - 2 VIEW  COMPARISON:  02/23/2010 FINDINGS: The heart size and mediastinal contours are within normal limits. Both lungs are clear. The visualized skeletal structures are unremarkable. IMPRESSION: No active cardiopulmonary disease. Electronically Signed   By: Inez Catalina M.D.   On: 09/25/2018 22:49   Ct Abdomen Pelvis W Contrast  Addendum Date: 09/26/2018   ADDENDUM REPORT: 09/26/2018 12:04 ADDENDUM: Review of CT scan of the abdomen and pelvis performed prior to ultrasound-guided paracentesis, demonstrates an approximately 2.4 x 2.4 cm hypoattenuating lesion within the caudal aspect the right lobe of the liver (image 9, series 7) as well as a questionable approximately 3.8 cm lesion within the dome of the anterior segment of the right lobe of the liver (image 18, series 2). Further evaluation with the acquisition of AFP level and abdominal MRI could be performed as clinically indicated Above findings  discussed with Dr. Posey Pronto on 09/26/2018 at 11:58. Electronically Signed   By: Sandi Mariscal M.D.   On: 09/26/2018 12:04   Result Date: 09/26/2018 CLINICAL DATA:  Abdominal distension EXAM: CT ABDOMEN AND PELVIS WITH CONTRAST TECHNIQUE: Multidetector CT imaging of the abdomen and pelvis was performed using the standard protocol following bolus administration of intravenous contrast. CONTRAST:  184mL OMNIPAQUE IOHEXOL 300 MG/ML  SOLN COMPARISON:  None. FINDINGS: Lower chest: No acute abnormality. Hepatobiliary: Shrunken liver with nodularity consistent with underlying cirrhosis. The gallbladder is within normal limits. Pancreas: Unremarkable. No pancreatic ductal dilatation or surrounding inflammatory changes. Spleen: Normal in size without focal abnormality. Adrenals/Urinary Tract: Adrenal glands are unremarkable. Kidneys are normal, without renal calculi, focal lesion, or hydronephrosis. Bladder is unremarkable. Stomach/Bowel: The appendix is not well visualized although no inflammatory changes are identified to suggest  appendicitis. Scattered large and small bowel gas is seen. No obstructive or inflammatory changes noted. Stomach is within normal limits. Vascular/Lymphatic: Aortic atherosclerosis. No enlarged abdominal or pelvic lymph nodes. Reproductive: Prostate is unremarkable. Other: Large volume ascites is identified consistent with the underlying cirrhosis. Musculoskeletal: Degenerative changes of lumbar spine are noted. IMPRESSION: Changes consistent with underlying cirrhosis with large volume ascites. No other focal abnormality is noted. Electronically Signed: By: Inez Catalina M.D. On: 09/25/2018 23:28   US Paracentesis  Result Date: 09/26/2018 INDICATION: History of cirrhosis, now with symptomatic intra-abdominal ascites. Please perform ultrasound-guided paracentesis for diagnostic purposes. EXAM: ULTRASOUND-GUIDED PARACENTESIS COMPARISON:  CT abdomen and pelvis-09/25/2018 MEDICATIONS: None. COMPLICATIONS: None immediate. TECHNIQUE: Informed written consent was obtained from the patient after a discussion of the risks, benefits and alternatives to treatment. A timeout was performed prior to the initiation of the procedure. Initial ultrasound scanning demonstrates a moderate amount of ascites within the right lower abdominal quadrant. Limited sonographic evaluation performed by the dictating interventional radiologist demonstrates an approximately 2.8 x 2.4 cm lesion within the subcapsular aspect the right lobe of the liver (image 9). The right lower abdomen was prepped and draped in the usual sterile fashion. 1% lidocaine with epinephrine was used for local anesthesia. An ultrasound image was saved for documentation purposed. An 8 Fr Safe-T-Centesis catheter was introduced. The paracentesis was performed. The catheter was removed and a dressing was applied. The patient tolerated the procedure well without immediate post procedural complication. FINDINGS: A total of approximately 5.6 liters of serous fluid was removed.  Samples were sent to the laboratory as requested by the clinical team. IMPRESSION: 1. Successful ultrasound-guided paracentesis yielding 5.6 liters of peritoneal fluid. 2. Incidentally noted approximately 2.8 cm lesion within the subcapsular aspect the right lobe of the liver. Further evaluation with the acquisition of AFP level and abdominal MRI could be performed as indicated. Above findings discussed with Dr. Posey Pronto at the time procedure completion. Electronically Signed   By: Sandi Mariscal M.D.   On: 09/26/2018 11:58     CODE STATUS:     Code Status Orders  (From admission, onward)         Start     Ordered   09/26/18 0056  Full code  Continuous     09/26/18 0055        Code Status History    This patient has a current code status but no historical code status.   Advance Care Planning Activity      TOTAL TIME TAKING CARE OF THIS PATIENT: *40* minutes.    [name] M.D on 09/26/2018 at 12:19 PM  Between 7am to 6pm -  Pager - [phone] After 6pm go to [email or web address] - password [email or web address] [facility]  Office  [phone]  CC: Primary care physician; Patient, No Pcp Per

## 2018-09-26 NOTE — Progress Notes (Signed)
[name], male   DOB: 1960/12/14, 58 y.o.   MRN: [id number] left message at the cancer center appointment desk to call patient with a new appointment for evaluation of hepatocellular carcinoma with Dr. Grayland Ormond. I did leave patient's phone number for cancer center to get in touch with him.

## 2018-09-26 NOTE — Consult Note (Signed)
David Lame, MD [facility]  [address], [place], [place] [address] Phone: [phone] Fax : [phone]  Consultation  Referring Provider:     Dr. Posey Pronto Primary Care Physician:  Patient, No Pcp Per Primary Gastroenterologist:  Althia Forts         Reason for Consultation:     Ascites  Date of Admission:  09/25/2018 Date of Consultation:  09/26/2018         HPI:   David Brown is a 58 y.o. male who has extensive history of alcohol abuse and reports that he thinks he drinks approximately 32 beers a week. The patient reports that he came in because of abdominal pain.  The patient also smokes 1-2 packs of cigarettes per day.  The patient states that his abdominal pain was associated with shortness of breath and abdominal tightness.  He denies having a primary care provider nor does he report ever seeing a gastrologist in the past.  The patient had a CT scan in the emergency room which showed a large volume of ascites with cirrhosis.  The patient's liver enzymes showed: Component     Latest Ref Rng & Units 09/25/2018 09/26/2018 09/26/2018          1:58 AM  5:30 AM  Albumin     3.5 - 5.0 g/dL 2.8 (L)    AST     15 - 41 U/L 73 (H)    ALT     0 - 44 U/L 42    Alkaline Phosphatase     38 - 126 U/L 141 (H)    Total Bilirubin     0.3 - 1.2 mg/dL 1.4 (H)  2.7 (H)   The patient had a paracentesis today that was done just prior to me seeing the patient with 5.6 L of fluid removed. He now reports is feeling much better as far as his abdominal pain is concerned. On the imaging the patient was found to have a incidental 2.8 cm lesion in the right lobe of the liver suspicious for possible malignancy. On reviewing the CT scan the lesion was seen and reported as a 2.4 x 2.4cm  hypoattenuation lesion in the right lobe of the liver and a questionable 3.8 cm lesion at the dome of the right liver.  Tumor markers were sent today.  History reviewed. No pertinent past medical history.  History  reviewed. No pertinent surgical history.  Prior to Admission medications   Medication Sig Start Date End Date Taking? Authorizing Provider  furosemide (LASIX) 20 MG tablet Take 1 tablet (20 mg total) by mouth daily. 09/26/18   [name], MD  Multiple Vitamin (MULTIVITAMIN WITH MINERALS) TABS tablet Take 1 tablet by mouth daily. 09/27/18   [name], MD  spironolactone (ALDACTONE) 25 MG tablet Take 1 tablet (25 mg total) by mouth daily. 09/26/18   [name], MD    History reviewed. No pertinent family history.   Social History   Tobacco Use  . Smoking status: Current Every Day Smoker  . Smokeless tobacco: Never Used  Substance Use Topics  . Alcohol use: Yes  . Drug use: Not on file    Allergies as of 09/25/2018  . (Not on File)    Review of Systems:    All systems reviewed and negative except where noted in HPI.   Physical Exam:  Vital signs in last 24 hours: Temp:  [98 F (36.7 C)-98.4 F (36.9 C)] 98.3 F (36.8 C) (07/08 1507) Pulse Rate:  [  80-99] 84 (07/08 1507) Resp:  [16-20] 20 (07/08 0411) BP: (102-154)/(66-102) 108/85 (07/08 1507) SpO2:  [93 %-100 %] 98 % (07/08 1507) Weight:  [75.9 kg] 75.9 kg (07/08 0200) Last BM Date: 09/24/18 General:   Pleasant, cooperative in NAD Head:  Normocephalic and atraumatic. Eyes:   No icterus.   Conjunctiva pink. PERRLA. Ears:  Normal auditory acuity. Neck:  Supple; no masses or thyroidomegaly Lungs: Respirations even and unlabored. Lungs clear to auscultation bilaterally.   No wheezes, crackles, or rhonchi.  Heart:  Regular rate and rhythm;  Without murmur, clicks, rubs or gallops Abdomen:  Soft, nondistended, nontender. Normal bowel sounds. No appreciable masses or hepatomegaly.  No rebound or guarding.  Rectal:  Not performed. Msk:  Symmetrical without gross deformities.   Extremities:  Without edema, cyanosis or clubbing. Neurologic:  Alert and oriented x3;  grossly normal neurologically. Skin:  Intact without significant  lesions or rashes. Cervical Nodes:  No significant cervical adenopathy. Psych:  Alert and cooperative. Normal affect.  LAB RESULTS: Recent Labs    09/25/18 1619 09/26/18 0158 09/26/18 0530  WBC 6.4 8.9 7.2  HGB 13.5 13.6 12.5*  HCT 39.4 39.2 36.3*  PLT 120* 119* 102*   BMET Recent Labs    09/25/18 1619 09/26/18 0158 09/26/18 0530  NA 136  --  134*  K 3.9  --  3.6  CL 105  --  103  CO2 22  --  24  GLUCOSE 138*  --  93  BUN 11  --  11  CREATININE 0.65 0.67 0.62  CALCIUM 8.4*  --  7.9*   LFT Recent Labs    09/25/18 1619 09/26/18 0530  PROT 6.9  --   ALBUMIN 2.8*  --   AST 73*  --   ALT 42  --   ALKPHOS 141*  --   BILITOT 1.4* 2.7*   PT/INR Recent Labs    09/26/18 0530  LABPROT 18.9*  INR 1.6*    STUDIES: Dg Chest 2 View  Result Date: 09/25/2018 CLINICAL DATA:  Shortness of breath EXAM: CHEST - 2 VIEW COMPARISON:  02/23/2010 FINDINGS: The heart size and mediastinal contours are within normal limits. Both lungs are clear. The visualized skeletal structures are unremarkable. IMPRESSION: No active cardiopulmonary disease. Electronically Signed   By: Inez Catalina M.D.   On: 09/25/2018 22:49   Ct Abdomen Pelvis W Contrast  Addendum Date: 09/26/2018   ADDENDUM REPORT: 09/26/2018 12:04 ADDENDUM: Review of CT scan of the abdomen and pelvis performed prior to ultrasound-guided paracentesis, demonstrates an approximately 2.4 x 2.4 cm hypoattenuating lesion within the caudal aspect the right lobe of the liver (image 9, series 7) as well as a questionable approximately 3.8 cm lesion within the dome of the anterior segment of the right lobe of the liver (image 18, series 2). Further evaluation with the acquisition of AFP level and abdominal MRI could be performed as clinically indicated Above findings discussed with Dr. Posey Pronto on 09/26/2018 at 11:58. Electronically Signed   By: Sandi Mariscal M.D.   On: 09/26/2018 12:04   Result Date: 09/26/2018 CLINICAL DATA:  Abdominal distension  EXAM: CT ABDOMEN AND PELVIS WITH CONTRAST TECHNIQUE: Multidetector CT imaging of the abdomen and pelvis was performed using the standard protocol following bolus administration of intravenous contrast. CONTRAST:  15mL OMNIPAQUE IOHEXOL 300 MG/ML  SOLN COMPARISON:  None. FINDINGS: Lower chest: No acute abnormality. Hepatobiliary: Shrunken liver with nodularity consistent with underlying cirrhosis. The gallbladder is within normal limits. Pancreas: Unremarkable. No  pancreatic ductal dilatation or surrounding inflammatory changes. Spleen: Normal in size without focal abnormality. Adrenals/Urinary Tract: Adrenal glands are unremarkable. Kidneys are normal, without renal calculi, focal lesion, or hydronephrosis. Bladder is unremarkable. Stomach/Bowel: The appendix is not well visualized although no inflammatory changes are identified to suggest appendicitis. Scattered large and small bowel gas is seen. No obstructive or inflammatory changes noted. Stomach is within normal limits. Vascular/Lymphatic: Aortic atherosclerosis. No enlarged abdominal or pelvic lymph nodes. Reproductive: Prostate is unremarkable. Other: Large volume ascites is identified consistent with the underlying cirrhosis. Musculoskeletal: Degenerative changes of lumbar spine are noted. IMPRESSION: Changes consistent with underlying cirrhosis with large volume ascites. No other focal abnormality is noted. Electronically Signed: By: Inez Catalina M.D. On: 09/25/2018 23:28   US Paracentesis  Result Date: 09/26/2018 INDICATION: History of cirrhosis, now with symptomatic intra-abdominal ascites. Please perform ultrasound-guided paracentesis for diagnostic purposes. EXAM: ULTRASOUND-GUIDED PARACENTESIS COMPARISON:  CT abdomen and pelvis-09/25/2018 MEDICATIONS: None. COMPLICATIONS: None immediate. TECHNIQUE: Informed written consent was obtained from the patient after a discussion of the risks, benefits and alternatives to treatment. A timeout was performed  prior to the initiation of the procedure. Initial ultrasound scanning demonstrates a moderate amount of ascites within the right lower abdominal quadrant. Limited sonographic evaluation performed by the dictating interventional radiologist demonstrates an approximately 2.8 x 2.4 cm lesion within the subcapsular aspect the right lobe of the liver (image 9). The right lower abdomen was prepped and draped in the usual sterile fashion. 1% lidocaine with epinephrine was used for local anesthesia. An ultrasound image was saved for documentation purposed. An 8 Fr Safe-T-Centesis catheter was introduced. The paracentesis was performed. The catheter was removed and a dressing was applied. The patient tolerated the procedure well without immediate post procedural complication. FINDINGS: A total of approximately 5.6 liters of serous fluid was removed. Samples were sent to the laboratory as requested by the clinical team. IMPRESSION: 1. Successful ultrasound-guided paracentesis yielding 5.6 liters of peritoneal fluid. 2. Incidentally noted approximately 2.8 cm lesion within the subcapsular aspect the right lobe of the liver. Further evaluation with the acquisition of AFP level and abdominal MRI could be performed as indicated. Above findings discussed with Dr. Posey Pronto at the time procedure completion. Electronically Signed   By: Sandi Mariscal M.D.   On: 09/26/2018 11:58      Impression / Plan:   Assessment: Active Problems:   Ascites   DUVALL COMES is a 58 y.o. y/o male with What appears to be cirrhosis and possible liver mass is seen on CT and ultrasound.  The patient had a large-volume paracentesis and feels much better.  The patient's abdomen is soft when I saw him today.  The patient has a extensive history of alcohol abuse.  He also has labs pending for other possible causes of abnormal liver enzymes.   Plan:  The patient should be started on a low-dose of Lasix and Aldactone with adjustment of these as  his kidney function is monitored and well at works with his ascites.  The patient has been encouraged to not drink any more and he states that he is done with alcohol use.  The patient will also be set up with oncology for the liver lesions.  Nothing further to do acutely on this patient with chronic liver disease.  The patient should follow-up with me as an outpatient.  I will sign off.  Please call if any further GI concerns or questions.  We would like to thank you for the  opportunity to participate in the care of GARWOOD WENTZELL.    Thank you for involving me in the care of this patient.      LOS: 0 days   David Lame, MD  09/26/2018, 4:38 PM    Note: This dictation was prepared with Dragon dictation along with smaller phrase technology. Any transcriptional errors that result from this process are unintentional.

## 2018-09-26 NOTE — Progress Notes (Signed)
PHARMACIST - PHYSICIAN COMMUNICATION  CONCERNING: IV to Oral Route Change Policy  RECOMMENDATION: This patient is receiving pantoprazole by the intravenous route.  Based on criteria approved by the Pharmacy and Therapeutics Committee, the intravenous medication(s) is/are being converted to the equivalent oral dose form(s).   DESCRIPTION: These criteria include:  The patient is eating (either orally or via tube) and/or has been taking other orally administered medications for a least 24 hours  The patient has no evidence of active gastrointestinal bleeding or impaired GI absorption (gastrectomy, short bowel, patient on TNA or NPO).  If you have questions about this conversion, please contact the Pharmacy Department   []   [phone] )  [place], [facility] 09/26/2018 11:03 AM

## 2018-09-26 NOTE — Procedures (Signed)
Pre Procedural Dx: Symptomatic Ascites Post Procedural Dx: Same  A total of approximately 5.6 liters of serous fluid was removed.   Samples were sent to the laboratory as requested by the clinical team.  EBL: None Complications: None immediate  Incidentally noted approximately 2.8 cm lesion within the subcapsular aspect the right lobe of the liver.   Further evaluation with the acquisition of AFP level and abdominal MRI could be performed as indicated.    Above findings discussed with Dr. Posey Pronto at the time procedure completion.   Ronny Bacon, MD Pager #: [phone]

## 2018-09-27 LAB — HEPATITIS PANEL, ACUTE
HCV Ab: >11 s/co ratio — ABNORMAL HIGH (ref 0.0–0.9)
Hep A IgM: NEGATIVE
Hep B C IgM: NEGATIVE
Hepatitis B Surface Ag: NEGATIVE

## 2018-09-27 LAB — AFP TUMOR MARKER: AFP, Serum, Tumor Marker: 9.8 ng/mL — ABNORMAL HIGH (ref 0.0–8.3)

## 2018-09-27 LAB — HIV ANTIBODY (ROUTINE TESTING W REFLEX): HIV Screen 4th Generation wRfx: NONREACTIVE

## 2018-09-27 LAB — CA 19-9 (SERIAL): CA 19-9: 26 U/mL (ref 0–35)

## 2018-09-27 LAB — PH, BODY FLUID: pH, Body Fluid: 7.6

## 2018-09-27 LAB — PROTEIN, BODY FLUID (OTHER): Total Protein, Body Fluid Other: 2.1 g/dL

## 2018-09-27 LAB — TRIGLYCERIDES, BODY FLUIDS: Triglycerides, Fluid: 50 mg/dL

## 2018-09-28 ENCOUNTER — Encounter: Payer: Self-pay | Admitting: Oncology

## 2018-09-28 ENCOUNTER — Other Ambulatory Visit: Payer: Self-pay

## 2018-09-28 ENCOUNTER — Inpatient Hospital Stay: Payer: Medicaid Other | Attending: Oncology | Admitting: Oncology

## 2018-09-28 VITALS — BP 128/89 | HR 94 | Temp 96.3°F | Ht 67.0 in | Wt 159.0 lb

## 2018-09-28 DIAGNOSIS — R188 Other ascites: Secondary | ICD-10-CM | POA: Diagnosis not present

## 2018-09-28 DIAGNOSIS — R160 Hepatomegaly, not elsewhere classified: Secondary | ICD-10-CM | POA: Diagnosis not present

## 2018-09-28 DIAGNOSIS — K7460 Unspecified cirrhosis of liver: Secondary | ICD-10-CM | POA: Insufficient documentation

## 2018-09-28 DIAGNOSIS — Z806 Family history of leukemia: Secondary | ICD-10-CM | POA: Insufficient documentation

## 2018-09-28 DIAGNOSIS — Z720 Tobacco use: Secondary | ICD-10-CM | POA: Insufficient documentation

## 2018-09-28 DIAGNOSIS — C220 Liver cell carcinoma: Secondary | ICD-10-CM | POA: Diagnosis not present

## 2018-09-28 LAB — CYTOLOGY - NON PAP

## 2018-09-28 NOTE — Progress Notes (Signed)
Patient is here today to establish care for hepatocellular mass.

## 2018-09-29 LAB — BODY FLUID CULTURE: Culture: NO GROWTH
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
## 2018-10-01 NOTE — Progress Notes (Signed)
Met with David Brown during and after consult with Dr. Grayland Ormond. Introduced Therapist, nutritional and provided contact information for future needs. Provided PET appt with instructions. Biopsy being scheduled. He has appointment with Dr. Grayland Ormond arranged for results.

## 2018-10-02 ENCOUNTER — Other Ambulatory Visit: Payer: Self-pay | Admitting: Oncology

## 2018-10-02 ENCOUNTER — Other Ambulatory Visit: Payer: Self-pay | Admitting: *Deleted

## 2018-10-02 ENCOUNTER — Telehealth: Payer: Self-pay | Admitting: *Deleted

## 2018-10-02 DIAGNOSIS — K769 Liver disease, unspecified: Secondary | ICD-10-CM

## 2018-10-02 LAB — LIPASE, FLUID: Lipase-Fluid: 17 U/L

## 2018-10-02 NOTE — Telephone Encounter (Signed)
Pt made aware of new appt for MRI and appt change for follow up with Dr. Grayland Ormond. Appts mailed per pt request. Reminded pt that he can view his appts on MyChart as well. Pt verbalized understanding.

## 2018-10-04 ENCOUNTER — Other Ambulatory Visit: Payer: Self-pay

## 2018-10-04 ENCOUNTER — Telehealth: Payer: Self-pay | Admitting: *Deleted

## 2018-10-04 DIAGNOSIS — R160 Hepatomegaly, not elsewhere classified: Secondary | ICD-10-CM

## 2018-10-04 DIAGNOSIS — R188 Other ascites: Secondary | ICD-10-CM

## 2018-10-04 MED ORDER — TRAMADOL HCL 50 MG PO TABS: 50.0000 mg | ORAL_TABLET | Freq: Four times a day (QID) | ORAL | 0 refills | Status: DC | PRN

## 2018-10-04 NOTE — Telephone Encounter (Signed)
Sister called reporting that patient is having abdominal pain and that he is unable to sleep at night and also thinks his fluid is building up again. Please advise

## 2018-10-04 NOTE — Telephone Encounter (Signed)
Called patient's sister to let her know that his prescription was going to be sent. I also informed her that he was scheduled to have a paracentesis tomorrow. She agreed and stated that she will take her brother.

## 2018-10-04 NOTE — Progress Notes (Signed)
Tumor Board Documentation  David Brown was presented by Dr Grayland Ormond at our Tumor Board on 10/04/2018, which included representatives from medical oncology, radiation oncology, surgical oncology, surgical, radiology, pathology, pharmacy, internal medicine, navigation, research, palliative care, pulmonology.  David Brown currently presents as a new patient, for discussion with history of the following treatments: active survellience.  Additionally, we reviewed previous medical and familial history, history of present illness, and recent lab results along with all available histopathologic and imaging studies. The tumor board considered available treatment options and made the following recommendations: Additional screening MRI, possible Ablation and Observation  The following procedures/referrals were also placed: No orders of the defined types were placed in this encounter.   Clinical Trial Status: not discussed   Staging used: To be determined  National site-specific guidelines   were discussed with respect to the case.  Tumor board is a meeting of clinicians from various specialty areas who evaluate and discuss patients for whom a multidisciplinary approach is being considered. Final determinations in the plan of care are those of the provider(s). The responsibility for follow up of recommendations given during tumor board is that of the provider.   Today's extended care, comprehensive team conference, [place] was not present for the discussion and was not examined.   Multidisciplinary Tumor Board is a multidisciplinary case peer review process.  Decisions discussed in the Multidisciplinary Tumor Board reflect the opinions of the specialists present at the conference without having examined the patient.  Ultimately, treatment and diagnostic decisions rest with the primary provider(s) and the patient.

## 2018-10-05 ENCOUNTER — Other Ambulatory Visit: Payer: Self-pay

## 2018-10-05 ENCOUNTER — Ambulatory Visit
Admission: RE | Admit: 2018-10-05 | Discharge: 2018-10-05 | Disposition: A | Discharge status: Home / Self Care | Payer: Medicaid Other | Source: Ambulatory Visit | Attending: Oncology | Admitting: Oncology

## 2018-10-05 DIAGNOSIS — R188 Other ascites: Secondary | ICD-10-CM | POA: Diagnosis not present

## 2018-10-05 NOTE — Procedures (Signed)
Pre Procedural Dx: Symptomatic Ascites Post Procedural Dx: Same  Successful US guided paracentesis yielding 7 L of serous ascitic fluid.  EBL: None Complications: None immediate  Ronny Bacon, MD Pager #: [phone]

## 2018-10-08 ENCOUNTER — Other Ambulatory Visit: Payer: Self-pay

## 2018-10-08 ENCOUNTER — Ambulatory Visit
Admission: RE | Admit: 2018-10-08 | Discharge: 2018-10-08 | Disposition: A | Discharge status: Home / Self Care | Payer: Medicaid Other | Source: Ambulatory Visit | Attending: Oncology | Admitting: Oncology

## 2018-10-08 DIAGNOSIS — R160 Hepatomegaly, not elsewhere classified: Secondary | ICD-10-CM | POA: Insufficient documentation

## 2018-10-08 LAB — GLUCOSE, CAPILLARY: Glucose-Capillary: 88 mg/dL (ref 70–99)

## 2018-10-08 MED ORDER — FLUDEOXYGLUCOSE F - 18 (FDG) INJECTION
8.2000 | Freq: Once | INTRAVENOUS | Status: AC | PRN
  Administered 2018-10-08: 9.14 via INTRAVENOUS

## 2018-10-10 NOTE — Progress Notes (Signed)
Primary Care Physician: Default, Provider, MD  Primary Gastroenterologist:  Dr. [name]  Chief Complaint  Patient presents with   Cirrhosis    HPI: David Brown is a 58 y.o. male here for follow-up after being discharged from the hospital.  The patient was in the hospital with a large amount of ascites.  The patient has a extensive alcohol abuse history with drinking approximately 30+ drinks a week.  The patient also had a CT scan while in the hospital that showed:  Review of CT scan of the abdomen and pelvis performed prior to ultrasound-guided paracentesis, demonstrates an approximately 2.4 x 2.4 cm hypoattenuating lesion within the caudal aspect the right lobe of the liver (image 9, series 7) as well as a questionable approximately 3.8 cm lesion within the dome of the anterior segment of the right lobe of the liver (image 18, series 2).  The patient's alpha-fetoprotein was only slightly elevated at 9.8.  The patient had a paracentesis with negative cytology and no sign of SBP.  The patient is also being followed by oncology. The patient has an MRI set up for tomorrow and is supposed to follow-up with  oncology on Monday.  Current Outpatient Medications  Medication Sig Dispense Refill   furosemide (LASIX) 20 MG tablet Take 1 tablet (20 mg total) by mouth daily. 30 tablet 0   Multiple Vitamin (MULTIVITAMIN WITH MINERALS) TABS tablet Take 1 tablet by mouth daily. 30 tablet 0   spironolactone (ALDACTONE) 25 MG tablet Take 1 tablet (25 mg total) by mouth daily. 30 tablet 0   traMADol (ULTRAM) 50 MG tablet Take 1 tablet (50 mg total) by mouth every 6 (six) hours as needed. 60 tablet 0   No current facility-administered medications for this visit.     Allergies as of 10/11/2018   (No Known Allergies)    ROS:  General: Negative for anorexia, weight loss, fever, chills, fatigue, weakness. ENT: Negative for hoarseness, difficulty swallowing , nasal  congestion. CV: Negative for chest pain, angina, palpitations, dyspnea on exertion, peripheral edema.  Respiratory: Negative for dyspnea at rest, dyspnea on exertion, cough, sputum, wheezing.  GI: See history of present illness. GU:  Negative for dysuria, hematuria, urinary incontinence, urinary frequency, nocturnal urination.  Endo: Negative for unusual weight change.    Physical Examination:   BP 114/77    Pulse 96    Temp 99.3 F (37.4 C) (Oral)    Ht 5\' 7"  (1.702 m)    Wt 160 lb 6.4 oz (72.8 kg)    BMI 25.12 kg/m   General: Well-nourished, well-developed in no acute distress.  Eyes: No icterus. Conjunctivae pink. Abdomen: Bowel sounds are normal, nontender, positive distention with ascites, no hepatosplenomegaly or masses, no abdominal bruits or hernia , no rebound or guarding.   Extremities: No lower extremity edema. No clubbing or deformities. Neuro: Alert and oriented x 3.  Grossly intact. Skin: Warm and dry, no jaundice.   Psych: Alert and cooperative, normal mood and affect.  Labs:   Imaging Studies: Dg Chest 2 View  Result Date: 09/25/2018 CLINICAL DATA:  Shortness of breath EXAM: CHEST - 2 VIEW COMPARISON:  02/23/2010 FINDINGS: The heart size and mediastinal contours are within normal limits. Both lungs are clear. The visualized skeletal structures are unremarkable. IMPRESSION: No active cardiopulmonary disease. Electronically Signed   By: Inez Catalina M.D.   On: 09/25/2018 22:49   Ct Abdomen Pelvis W Contrast  Addendum Date: 09/26/2018   ADDENDUM REPORT: 09/26/2018 12:04  ADDENDUM: Review of CT scan of the abdomen and pelvis performed prior to ultrasound-guided paracentesis, demonstrates an approximately 2.4 x 2.4 cm hypoattenuating lesion within the caudal aspect the right lobe of the liver (image 9, series 7) as well as a questionable approximately 3.8 cm lesion within the dome of the anterior segment of the right lobe of the liver (image 18, series 2). Further evaluation  with the acquisition of AFP level and abdominal MRI could be performed as clinically indicated Above findings discussed with Dr. Posey Pronto on 09/26/2018 at 11:58. Electronically Signed   By: Sandi Mariscal M.D.   On: 09/26/2018 12:04   Result Date: 09/26/2018 CLINICAL DATA:  Abdominal distension EXAM: CT ABDOMEN AND PELVIS WITH CONTRAST TECHNIQUE: Multidetector CT imaging of the abdomen and pelvis was performed using the standard protocol following bolus administration of intravenous contrast. CONTRAST:  158mL OMNIPAQUE IOHEXOL 300 MG/ML  SOLN COMPARISON:  None. FINDINGS: Lower chest: No acute abnormality. Hepatobiliary: Shrunken liver with nodularity consistent with underlying cirrhosis. The gallbladder is within normal limits. Pancreas: Unremarkable. No pancreatic ductal dilatation or surrounding inflammatory changes. Spleen: Normal in size without focal abnormality. Adrenals/Urinary Tract: Adrenal glands are unremarkable. Kidneys are normal, without renal calculi, focal lesion, or hydronephrosis. Bladder is unremarkable. Stomach/Bowel: The appendix is not well visualized although no inflammatory changes are identified to suggest appendicitis. Scattered large and small bowel gas is seen. No obstructive or inflammatory changes noted. Stomach is within normal limits. Vascular/Lymphatic: Aortic atherosclerosis. No enlarged abdominal or pelvic lymph nodes. Reproductive: Prostate is unremarkable. Other: Large volume ascites is identified consistent with the underlying cirrhosis. Musculoskeletal: Degenerative changes of lumbar spine are noted. IMPRESSION: Changes consistent with underlying cirrhosis with large volume ascites. No other focal abnormality is noted. Electronically Signed: By: Inez Catalina M.D. On: 09/25/2018 23:28   Nm Pet Image Initial (pi) Skull Base To Thigh  Result Date: 10/08/2018 CLINICAL DATA:  Initial treatment strategy for liver mass. EXAM: NUCLEAR MEDICINE PET SKULL BASE TO THIGH TECHNIQUE: 9.1 mCi  F-18 FDG was injected intravenously. Full-ring PET imaging was performed from the skull base to thigh after the radiotracer. CT data was obtained and used for attenuation correction and anatomic localization. Fasting blood glucose: 88 mg/dl COMPARISON:  Abdomen/pelvis CT 09/25/2018 FINDINGS: Mediastinal blood pool activity: SUV max 2.1 Liver activity: SUV max NA NECK: Small focus of hypermetabolism in the right neck without underlying lymphadenopathy is indeterminate. Incidental CT findings: none CHEST: No hypermetabolic mediastinal or hilar nodes. No suspicious pulmonary nodules on the CT scan. Incidental CT findings: Coronary artery calcification is evident. Atherosclerotic calcification is noted in the wall of the thoracic aorta. Bilateral gynecomastia. ABDOMEN/PELVIS: No abnormal hypermetabolic activity within the liver, pancreas, adrenal glands, or spleen. No hypermetabolic lymph nodes in the abdomen or pelvis. No discernible hypermetabolism associated with the right hepatic lesion evident on prior CT. Incidental CT findings: Nodular irregular hepatic contour compatible with cirrhosis. Moderate to large volume ascites. There is abdominal aortic atherosclerosis without aneurysm. SKELETON: No focal hypermetabolic activity to suggest skeletal metastasis. Incidental CT findings: None. IMPRESSION: 1. No hypermetabolism associated with the patient's suspicious right hepatic lesion. MRI may prove helpful to further evaluate. 2. Small focus of hypermetabolism in the right neck without underlying lymphadenopathy/etiology. Finding is indeterminate. 3. No suspicious or unexpected hypermetabolism in the chest, abdomen, or pelvis. 4. Cirrhotic liver morphology with moderate to large volume ascites. 5.  Aortic Atherosclerois (ICD10-170.0) Electronically Signed   By: Misty Stanley M.D.   On: 10/08/2018 14:43   US  Paracentesis  Result Date: 10/05/2018 INDICATION: History of cirrhosis with recurrent symptomatic ascites.  Please perform ultrasound-guided paracentesis for therapeutic purposes. EXAM: ULTRASOUND-GUIDED PARACENTESIS COMPARISON:  Ultrasound-guided paracentesis performed 09/26/2018 yielding 5.6 L of peritoneal fluid. MEDICATIONS: None. COMPLICATIONS: None immediate. TECHNIQUE: Informed written consent was obtained from the patient after a discussion of the risks, benefits and alternatives to treatment. A timeout was performed prior to the initiation of the procedure. Initial ultrasound scanning demonstrates a large amount of ascites within the right lower abdominal quadrant. The right lower abdomen was prepped and draped in the usual sterile fashion. 1% lidocaine with epinephrine was used for local anesthesia. An ultrasound image was saved for documentation purposed. An 8 Fr Safe-T-Centesis catheter was introduced. The paracentesis was performed. The catheter was removed and a dressing was applied. The patient tolerated the procedure well without immediate post procedural complication. FINDINGS: A total of approximately 7 liters of serous fluid was removed. IMPRESSION: Successful ultrasound-guided paracentesis yielding 7 liters of peritoneal fluid. Electronically Signed   By: Sandi Mariscal M.D.   On: 10/05/2018 15:24   US Paracentesis  Result Date: 09/26/2018 INDICATION: History of cirrhosis, now with symptomatic intra-abdominal ascites. Please perform ultrasound-guided paracentesis for diagnostic purposes. EXAM: ULTRASOUND-GUIDED PARACENTESIS COMPARISON:  CT abdomen and pelvis-09/25/2018 MEDICATIONS: None. COMPLICATIONS: None immediate. TECHNIQUE: Informed written consent was obtained from the patient after a discussion of the risks, benefits and alternatives to treatment. A timeout was performed prior to the initiation of the procedure. Initial ultrasound scanning demonstrates a moderate amount of ascites within the right lower abdominal quadrant. Limited sonographic evaluation performed by the dictating interventional  radiologist demonstrates an approximately 2.8 x 2.4 cm lesion within the subcapsular aspect the right lobe of the liver (image 9). The right lower abdomen was prepped and draped in the usual sterile fashion. 1% lidocaine with epinephrine was used for local anesthesia. An ultrasound image was saved for documentation purposed. An 8 Fr Safe-T-Centesis catheter was introduced. The paracentesis was performed. The catheter was removed and a dressing was applied. The patient tolerated the procedure well without immediate post procedural complication. FINDINGS: A total of approximately 5.6 liters of serous fluid was removed. Samples were sent to the laboratory as requested by the clinical team. IMPRESSION: 1. Successful ultrasound-guided paracentesis yielding 5.6 liters of peritoneal fluid. 2. Incidentally noted approximately 2.8 cm lesion within the subcapsular aspect the right lobe of the liver. Further evaluation with the acquisition of AFP level and abdominal MRI could be performed as indicated. Above findings discussed with Dr. Posey Pronto at the time procedure completion. Electronically Signed   By: Sandi Mariscal M.D.   On: 09/26/2018 11:58    Assessment and Plan:   David Brown is a 58 y.o. y/o male who comes in today with a history of alcoholic cirrhosis and has stopped drinking.  The patient had a lesion seen on the liver and a alpha-fetoprotein that was only slightly elevated at 9.8.  The patient will have his lab sent off for a CBC, INR and BMP with the intention to adjust his diuretics so that he will not need further paracenteses.  The patient has been explained the plan and agrees with it.    [name], MD. Mar[facility] Regal   Note: This dictation was prepared with Dragon dictation along with smaller phrase technology. Any transcriptional errors that result from this process are unintentional.

## 2018-10-11 ENCOUNTER — Ambulatory Visit (INDEPENDENT_AMBULATORY_CARE_PROVIDER_SITE_OTHER): Payer: Self-pay | Admitting: Gastroenterology

## 2018-10-11 ENCOUNTER — Encounter: Payer: Self-pay | Admitting: Gastroenterology

## 2018-10-11 ENCOUNTER — Other Ambulatory Visit: Payer: Self-pay

## 2018-10-11 ENCOUNTER — Other Ambulatory Visit
Admission: RE | Admit: 2018-10-11 | Discharge: 2018-10-11 | Disposition: A | Discharge status: Home / Self Care | Payer: Medicaid Other | Attending: Gastroenterology | Admitting: Gastroenterology

## 2018-10-11 VITALS — BP 114/77 | HR 96 | Temp 99.3°F | Ht 67.0 in | Wt 160.4 lb

## 2018-10-11 DIAGNOSIS — K7460 Unspecified cirrhosis of liver: Secondary | ICD-10-CM | POA: Diagnosis present

## 2018-10-11 DIAGNOSIS — R188 Other ascites: Secondary | ICD-10-CM

## 2018-10-11 LAB — COMPREHENSIVE METABOLIC PANEL
ALT: 69 U/L — ABNORMAL HIGH (ref 0–44)
AST: 103 U/L — ABNORMAL HIGH (ref 15–41)
Albumin: 2.5 g/dL — ABNORMAL LOW (ref 3.5–5.0)
Alkaline Phosphatase: 122 U/L (ref 38–126)
Anion gap: 6 (ref 5–15)
BUN: 14 mg/dL (ref 6–20)
CO2: 28 mmol/L (ref 22–32)
Calcium: 8.6 mg/dL — ABNORMAL LOW (ref 8.9–10.3)
Chloride: 97 mmol/L — ABNORMAL LOW (ref 98–111)
Creatinine, Ser: 0.86 mg/dL (ref 0.61–1.24)
GFR calc Af Amer: >60 mL/min (ref >60)
GFR calc non Af Amer: >60 mL/min (ref >60)
Glucose, Bld: 101 mg/dL — ABNORMAL HIGH (ref 70–99)
Potassium: 5 mmol/L (ref 3.5–5.1)
Sodium: 131 mmol/L — ABNORMAL LOW (ref 135–145)
Total Bilirubin: 1.7 mg/dL — ABNORMAL HIGH (ref 0.3–1.2)
Total Protein: 6.9 g/dL (ref 6.5–8.1)

## 2018-10-11 LAB — CBC WITH DIFFERENTIAL/PLATELET
Abs Immature Granulocytes: 0.02 10*3/uL (ref 0.00–0.07)
Basophils Absolute: 0.1 10*3/uL (ref 0.0–0.1)
Basophils Relative: 1 %
Eosinophils Absolute: 0.2 10*3/uL (ref 0.0–0.5)
Eosinophils Relative: 2 %
HCT: 43.3 % (ref 39.0–52.0)
Hemoglobin: 15.1 g/dL (ref 13.0–17.0)
Immature Granulocytes: 0 %
Lymphocytes Relative: 31 %
Lymphs Abs: 2.5 10*3/uL (ref 0.7–4.0)
MCH: 34 pg (ref 26.0–34.0)
MCHC: 34.9 g/dL (ref 30.0–36.0)
MCV: 97.5 fL (ref 80.0–100.0)
Monocytes Absolute: 0.7 10*3/uL (ref 0.1–1.0)
Monocytes Relative: 9 %
Neutro Abs: 4.5 10*3/uL (ref 1.7–7.7)
Neutrophils Relative %: 57 %
Platelets: 140 10*3/uL — ABNORMAL LOW (ref 150–400)
RBC: 4.44 MIL/uL (ref 4.22–5.81)
RDW: 13 % (ref 11.5–15.5)
WBC: 7.9 10*3/uL (ref 4.0–10.5)
nRBC: 0 % (ref 0.0–0.2)

## 2018-10-11 LAB — PROTIME-INR
INR: 1.3 — ABNORMAL HIGH (ref 0.8–1.2)
Prothrombin Time: 16 seconds — ABNORMAL HIGH (ref 11.4–15.2)

## 2018-10-12 ENCOUNTER — Telehealth: Payer: Self-pay | Admitting: *Deleted

## 2018-10-12 ENCOUNTER — Other Ambulatory Visit: Payer: Self-pay

## 2018-10-12 ENCOUNTER — Ambulatory Visit
Admission: RE | Admit: 2018-10-12 | Discharge: 2018-10-12 | Disposition: A | Discharge status: Home / Self Care | Payer: Medicaid Other | Source: Ambulatory Visit | Attending: Oncology | Admitting: Oncology

## 2018-10-12 DIAGNOSIS — K769 Liver disease, unspecified: Secondary | ICD-10-CM | POA: Diagnosis present

## 2018-10-12 MED ORDER — GADOBUTROL 1 MMOL/ML IV SOLN
7.0000 mL | Freq: Once | INTRAVENOUS | Status: AC | PRN
  Administered 2018-10-12: 7 mL via INTRAVENOUS

## 2018-10-12 NOTE — Telephone Encounter (Signed)
Called report  IMPRESSION: 1. 2.3 x 2.2 cm inferior right liver lesion is probably in segment VI, near the junction of V and VI, and has imaging features characteristic of LI-RADS 5. This is considered diagnostic for hepatocellular carcinoma. 2. No other focal liver lesion evident. 3. Cirrhotic morphology of the liver. 4. Large volume ascites.  These results will be called to the ordering clinician or representative by the Radiologist Assistant, and communication documented in the PACS or zVision Dashboard.   Electronically Signed   By: Misty Stanley M.D.   On: 10/12/2018 12:05

## 2018-10-14 DIAGNOSIS — C220 Liver cell carcinoma: Secondary | ICD-10-CM | POA: Insufficient documentation
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
## 2018-10-15 ENCOUNTER — Encounter: Payer: Self-pay | Admitting: Oncology

## 2018-10-15 ENCOUNTER — Inpatient Hospital Stay (HOSPITAL_BASED_OUTPATIENT_CLINIC_OR_DEPARTMENT_OTHER): Payer: Medicaid Other | Admitting: Oncology

## 2018-10-15 ENCOUNTER — Other Ambulatory Visit: Payer: Self-pay

## 2018-10-15 DIAGNOSIS — R109 Unspecified abdominal pain: Secondary | ICD-10-CM

## 2018-10-15 DIAGNOSIS — K7460 Unspecified cirrhosis of liver: Secondary | ICD-10-CM

## 2018-10-15 DIAGNOSIS — R188 Other ascites: Secondary | ICD-10-CM

## 2018-10-15 DIAGNOSIS — C220 Liver cell carcinoma: Secondary | ICD-10-CM

## 2018-10-15 DIAGNOSIS — R160 Hepatomegaly, not elsewhere classified: Secondary | ICD-10-CM | POA: Diagnosis not present

## 2018-10-15 NOTE — Progress Notes (Signed)
Patient is here today to go over his images results. Patient stated that he has abdominal pain and swelling and back pain.

## 2018-10-16 ENCOUNTER — Other Ambulatory Visit: Payer: Self-pay | Admitting: Oncology

## 2018-10-16 ENCOUNTER — Other Ambulatory Visit: Payer: Self-pay

## 2018-10-16 ENCOUNTER — Other Ambulatory Visit
Admission: RE | Admit: 2018-10-16 | Discharge: 2018-10-16 | Disposition: A | Discharge status: Home / Self Care | Payer: Medicaid Other | Source: Ambulatory Visit | Attending: Gastroenterology | Admitting: Gastroenterology

## 2018-10-16 ENCOUNTER — Ambulatory Visit: Payer: Self-pay | Admitting: Oncology

## 2018-10-16 ENCOUNTER — Telehealth: Payer: Self-pay

## 2018-10-16 DIAGNOSIS — R188 Other ascites: Secondary | ICD-10-CM | POA: Diagnosis present

## 2018-10-16 DIAGNOSIS — K7460 Unspecified cirrhosis of liver: Secondary | ICD-10-CM

## 2018-10-16 DIAGNOSIS — R160 Hepatomegaly, not elsewhere classified: Secondary | ICD-10-CM

## 2018-10-16 LAB — BASIC METABOLIC PANEL
Anion gap: 6 (ref 5–15)
BUN: 14 mg/dL (ref 6–20)
CO2: 25 mmol/L (ref 22–32)
Calcium: 8.1 mg/dL — ABNORMAL LOW (ref 8.9–10.3)
Chloride: 99 mmol/L (ref 98–111)
Creatinine, Ser: 0.58 mg/dL — ABNORMAL LOW (ref 0.61–1.24)
GFR calc Af Amer: >60 mL/min (ref >60)
GFR calc non Af Amer: >60 mL/min (ref >60)
Glucose, Bld: 106 mg/dL — ABNORMAL HIGH (ref 70–99)
Potassium: 4 mmol/L (ref 3.5–5.1)
Sodium: 130 mmol/L — ABNORMAL LOW (ref 135–145)

## 2018-10-16 MED ORDER — SPIRONOLACTONE 50 MG PO TABS: 50.0000 mg | ORAL_TABLET | Freq: Every day | ORAL | 5 refills | Status: DC

## 2018-10-16 MED ORDER — FUROSEMIDE 40 MG PO TABS: 40.0000 mg | ORAL_TABLET | Freq: Every day | ORAL | 5 refills | Status: DC

## 2018-10-16 NOTE — Telephone Encounter (Signed)
-----   Message from [name], MD sent at 10/12/2018 11:39 AM EDT ----- Have the patient repeat his BMP in 1 week.

## 2018-10-16 NOTE — Telephone Encounter (Signed)
-----   Message from [name], MD sent at 10/12/2018 11:39 AM EDT ----- Let the patient know that some of his labs have improved but others have not.  His renal function is normal so he should double up on the medications he is taking now for his ascites including the Aldactone and Lasix.

## 2018-10-16 NOTE — Telephone Encounter (Signed)
Pt notified of lab results. Pt and sister, Fraser Din have been informed to increase Lasix 20mg  to 40mg  daily and Spironolactone 25mg  to 50mg  daily. Medication change rx has been sent to [facility]ication [facility].

## 2018-10-17 ENCOUNTER — Other Ambulatory Visit: Payer: Self-pay

## 2018-10-17 ENCOUNTER — Telehealth: Payer: Self-pay | Admitting: Gastroenterology

## 2018-10-17 MED ORDER — FUROSEMIDE 40 MG PO TABS: 40.0000 mg | ORAL_TABLET | Freq: Two times a day (BID) | ORAL | 3 refills | Status: DC

## 2018-10-17 MED ORDER — SPIRONOLACTONE 50 MG PO TABS: 50.0000 mg | ORAL_TABLET | Freq: Two times a day (BID) | ORAL | 3 refills | Status: DC

## 2018-10-17 NOTE — Telephone Encounter (Signed)
Mardene Celeste patient's sister called & wants to talk with Ginger. Patient had labs yesterday & bloating. He is in a lot of pain & is up every hour seems to have more fluid today. Please call & advise.

## 2018-10-17 NOTE — Telephone Encounter (Signed)
Spoke to pt's sister, Fraser Din and advised her per Dr Allen Norris to increase Lasix from 40mg  daily to 40mg  twice daily and Spironolactone from 50mg  daily to 50mg  twice daily. New rx's have been sent to medication management. Repeat labs in 1 week.

## 2018-10-18 ENCOUNTER — Other Ambulatory Visit: Payer: Self-pay

## 2018-10-18 NOTE — Progress Notes (Signed)
Tumor Board Documentation  David Brown was presented by Dr Grayland Ormond at our Tumor Board on 10/18/2018, which included representatives from medical oncology, radiation oncology, pathology, radiology, surgical, navigation, internal medicine, pharmacy, genetics, pulmonology, palliative care, research.  David Brown currently presents for discussion, as a new patient with history of the following treatments: active survellience.  Additionally, we reviewed previous medical and familial history, history of present illness, and recent lab results along with all available histopathologic and imaging studies. The tumor board considered available treatment options and made the following recommendations:   IR Ablation of liver lesion  The following procedures/referrals were also placed: No orders of the defined types were placed in this encounter.   Clinical Trial Status: not discussed   Staging used: Clinical Stage  AJCC Staging:       Group: Hepatocellular Carcinoma   National site-specific guidelines   were discussed with respect to the case.  Tumor board is a meeting of clinicians from various specialty areas who evaluate and discuss patients for whom a multidisciplinary approach is being considered. Final determinations in the plan of care are those of the provider(s). The responsibility for follow up of recommendations given during tumor board is that of the provider.   Today's extended care, comprehensive team conference, David Brown was not present for the discussion and was not examined.   Multidisciplinary Tumor Board is a multidisciplinary case peer review process.  Decisions discussed in the Multidisciplinary Tumor Board reflect the opinions of the specialists present at the conference without having examined the patient.  Ultimately, treatment and diagnostic decisions rest with the primary provider(s) and the patient.

## 2018-10-20 ENCOUNTER — Inpatient Hospital Stay
Admission: EM | Admit: 2018-10-20 | Discharge: 2018-10-22 | DRG: 432 | Disposition: A | Discharge status: Home / Self Care | Payer: Medicaid Other | Attending: Internal Medicine | Admitting: Internal Medicine

## 2018-10-20 ENCOUNTER — Other Ambulatory Visit: Payer: Self-pay

## 2018-10-20 DIAGNOSIS — C220 Liver cell carcinoma: Secondary | ICD-10-CM | POA: Diagnosis present

## 2018-10-20 DIAGNOSIS — Z79899 Other long term (current) drug therapy: Secondary | ICD-10-CM

## 2018-10-20 DIAGNOSIS — K7031 Alcoholic cirrhosis of liver with ascites: Principal | ICD-10-CM | POA: Diagnosis present

## 2018-10-20 DIAGNOSIS — Z20828 Contact with and (suspected) exposure to other viral communicable diseases: Secondary | ICD-10-CM | POA: Diagnosis present

## 2018-10-20 DIAGNOSIS — Z6825 Body mass index (BMI) 25.0-25.9, adult: Secondary | ICD-10-CM

## 2018-10-20 DIAGNOSIS — E871 Hypo-osmolality and hyponatremia: Secondary | ICD-10-CM | POA: Diagnosis present

## 2018-10-20 DIAGNOSIS — F17210 Nicotine dependence, cigarettes, uncomplicated: Secondary | ICD-10-CM | POA: Diagnosis present

## 2018-10-20 DIAGNOSIS — E43 Unspecified severe protein-calorie malnutrition: Secondary | ICD-10-CM | POA: Diagnosis present

## 2018-10-20 DIAGNOSIS — R188 Other ascites: Secondary | ICD-10-CM

## 2018-10-20 HISTORY — DX: Malignant (primary) neoplasm, unspecified: C80.1

## 2018-10-20 LAB — CBC WITH DIFFERENTIAL/PLATELET
Abs Immature Granulocytes: 0.03 10*3/uL (ref 0.00–0.07)
Basophils Absolute: 0 10*3/uL (ref 0.0–0.1)
Basophils Relative: 0 %
Eosinophils Absolute: 0.2 10*3/uL (ref 0.0–0.5)
Eosinophils Relative: 2 %
HCT: 39.4 % (ref 39.0–52.0)
Hemoglobin: 13.8 g/dL (ref 13.0–17.0)
Immature Granulocytes: 0 %
Lymphocytes Relative: 23 %
Lymphs Abs: 2.1 10*3/uL (ref 0.7–4.0)
MCH: 33.2 pg (ref 26.0–34.0)
MCHC: 35 g/dL (ref 30.0–36.0)
MCV: 94.7 fL (ref 80.0–100.0)
Monocytes Absolute: 1.1 10*3/uL — ABNORMAL HIGH (ref 0.1–1.0)
Monocytes Relative: 12 %
Neutro Abs: 5.8 10*3/uL (ref 1.7–7.7)
Neutrophils Relative %: 63 %
Platelets: 146 10*3/uL — ABNORMAL LOW (ref 150–400)
RBC: 4.16 MIL/uL — ABNORMAL LOW (ref 4.22–5.81)
RDW: 12.7 % (ref 11.5–15.5)
WBC: 9.2 10*3/uL (ref 4.0–10.5)
nRBC: 0 % (ref 0.0–0.2)

## 2018-10-20 LAB — COMPREHENSIVE METABOLIC PANEL
ALT: 62 U/L — ABNORMAL HIGH (ref 0–44)
AST: 103 U/L — ABNORMAL HIGH (ref 15–41)
Albumin: 2.5 g/dL — ABNORMAL LOW (ref 3.5–5.0)
Alkaline Phosphatase: 86 U/L (ref 38–126)
Anion gap: 7 (ref 5–15)
BUN: 18 mg/dL (ref 6–20)
CO2: 27 mmol/L (ref 22–32)
Calcium: 8.4 mg/dL — ABNORMAL LOW (ref 8.9–10.3)
Chloride: 96 mmol/L — ABNORMAL LOW (ref 98–111)
Creatinine, Ser: 0.86 mg/dL (ref 0.61–1.24)
GFR calc Af Amer: >60 mL/min (ref >60)
GFR calc non Af Amer: >60 mL/min (ref >60)
Glucose, Bld: 132 mg/dL — ABNORMAL HIGH (ref 70–99)
Potassium: 4.2 mmol/L (ref 3.5–5.1)
Sodium: 130 mmol/L — ABNORMAL LOW (ref 135–145)
Total Bilirubin: 2.9 mg/dL — ABNORMAL HIGH (ref 0.3–1.2)
Total Protein: 7 g/dL (ref 6.5–8.1)

## 2018-10-20 LAB — SARS CORONAVIRUS 2 BY RT PCR (HOSPITAL ORDER, PERFORMED IN ~~LOC~~ HOSPITAL LAB): SARS Coronavirus 2: NEGATIVE

## 2018-10-20 LAB — PROTIME-INR
INR: 1.3 — ABNORMAL HIGH (ref 0.8–1.2)
Prothrombin Time: 15.8 seconds — ABNORMAL HIGH (ref 11.4–15.2)

## 2018-10-20 LAB — APTT: aPTT: 33 seconds (ref 24–36)

## 2018-10-20 MED ORDER — ONDANSETRON HCL 4 MG/2ML IJ SOLN
4.0000 mg | Freq: Once | INTRAMUSCULAR | Status: AC
  Administered 2018-10-20: 19:00:00 4 mg via INTRAVENOUS
  Filled 2018-10-20: qty 2

## 2018-10-20 MED ORDER — ENOXAPARIN SODIUM 40 MG/0.4ML ~~LOC~~ SOLN: 40.0000 mg | SUBCUTANEOUS | Status: DC

## 2018-10-20 MED ORDER — MORPHINE SULFATE (PF) 2 MG/ML IV SOLN
2.0000 mg | Freq: Once | INTRAVENOUS | Status: AC
  Administered 2018-10-20: 2 mg via INTRAVENOUS
  Filled 2018-10-20: qty 1

## 2018-10-20 MED ORDER — MORPHINE SULFATE (PF) 2 MG/ML IV SOLN: 2.0000 mg | Freq: Once | INTRAVENOUS | Status: AC

## 2018-10-20 MED ORDER — SPIRONOLACTONE 25 MG PO TABS
50.0000 mg | ORAL_TABLET | Freq: Two times a day (BID) | ORAL | Status: DC
  Administered 2018-10-21 – 2018-10-22 (×4): 50 mg via ORAL
  Filled 2018-10-20 (×4): qty 2

## 2018-10-20 MED ORDER — FUROSEMIDE 40 MG PO TABS
40.0000 mg | ORAL_TABLET | Freq: Two times a day (BID) | ORAL | Status: DC
  Administered 2018-10-21 – 2018-10-22 (×3): 40 mg via ORAL
  Filled 2018-10-20 (×4): qty 1

## 2018-10-20 MED ORDER — MORPHINE SULFATE (PF) 2 MG/ML IV SOLN
1.0000 mg | Freq: Three times a day (TID) | INTRAVENOUS | Status: DC | PRN
  Administered 2018-10-21: 1 mg via INTRAVENOUS
  Filled 2018-10-20: qty 1

## 2018-10-20 MED ORDER — ADULT MULTIVITAMIN W/MINERALS CH
1.0000 | ORAL_TABLET | Freq: Every day | ORAL | Status: DC
  Administered 2018-10-21 – 2018-10-22 (×2): 1 via ORAL
  Filled 2018-10-20 (×2): qty 1

## 2018-10-20 MED ORDER — TRAMADOL HCL 50 MG PO TABS
50.0000 mg | ORAL_TABLET | Freq: Four times a day (QID) | ORAL | Status: DC | PRN
  Administered 2018-10-22: 13:00:00 50 mg via ORAL
  Filled 2018-10-20: qty 1

## 2018-10-20 MED ORDER — POLYETHYLENE GLYCOL 3350 17 G PO PACK
17.0000 g | PACK | Freq: Every day | ORAL | Status: DC | PRN
  Administered 2018-10-21: 09:00:00 17 g via ORAL
  Filled 2018-10-20: qty 1

## 2018-10-20 NOTE — ED Notes (Signed)
MD Patel at bedside.
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
## 2018-10-20 NOTE — ED Notes (Signed)
Pt st feeling in pain and sitting on chair to relief abd pressure.

## 2018-10-20 NOTE — ED Triage Notes (Addendum)
Pt from home via [facility]. Per EMS, pt present to ED with hx of liver cancer, liver cirrhosis. Per pt he is schedule on 8/18 for an ablation. Pt st he had "fluid taken off last week"  Since then he had experienced abd "bloating" and difficulty breathingon. Pt w/distented abd.

## 2018-10-20 NOTE — ED Notes (Signed)
Pt provided with water per MD Patel VO.
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
## 2018-10-20 NOTE — ED Notes (Signed)
ED Provider Malinda  at bedside. 
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
## 2018-10-20 NOTE — Progress Notes (Signed)
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 Comes in with abdominal tightness consistent with ascites. Code status address patient wants to be full code. Time spent 16 minutes [name], MD

## 2018-10-21 ENCOUNTER — Other Ambulatory Visit: Payer: Self-pay

## 2018-10-21 MED ORDER — ENSURE ENLIVE PO LIQD
237.0000 mL | Freq: Two times a day (BID) | ORAL | Status: DC
  Administered 2018-10-21 – 2018-10-22 (×2): 237 mL via ORAL

## 2018-10-21 MED ORDER — MORPHINE SULFATE (PF) 2 MG/ML IV SOLN
INTRAVENOUS | Status: AC
  Administered 2018-10-21: 2 mg via INTRAVENOUS
  Filled 2018-10-21: qty 1

## 2018-10-21 MED ORDER — MORPHINE SULFATE (PF) 2 MG/ML IV SOLN
1.0000 mg | INTRAVENOUS | Status: DC | PRN
  Administered 2018-10-21 – 2018-10-22 (×4): 1 mg via INTRAVENOUS
  Filled 2018-10-21 (×4): qty 1

## 2018-10-21 NOTE — Progress Notes (Signed)
[facility] at [place] NAME: David Brown    MR#:  [id number]  DATE OF BIRTH:  [birth year]  SUBJECTIVE:  CHIEF COMPLAINT:   Chief Complaint  Patient presents with  . Ascites   Came with worsening abdominal distention and ascites.  Still have significant distress due to abdominal distention.  Complaining of not able to eat due to tight abdomen.  REVIEW OF SYSTEMS:  CONSTITUTIONAL: No fever, fatigue or weakness.  EYES: No blurred or double vision.  EARS, NOSE, AND THROAT: No tinnitus or ear pain.  RESPIRATORY: No cough, shortness of breath, wheezing or hemoptysis.  CARDIOVASCULAR: No chest pain, orthopnea, edema.  GASTROINTESTINAL: No nausea, vomiting, diarrhea, have abdominal distention and abdominal pain.  GENITOURINARY: No dysuria, hematuria.  ENDOCRINE: No polyuria, nocturia,  HEMATOLOGY: No anemia, easy bruising or bleeding SKIN: No rash or lesion. MUSCULOSKELETAL: No joint pain or arthritis.   NEUROLOGIC: No tingling, numbness, weakness.  PSYCHIATRY: No anxiety or depression.   ROS  DRUG ALLERGIES:  No Known Allergies  VITALS:  Blood pressure (!) 134/94, pulse (!) 103, temperature 98.7 F (37.1 C), resp. rate 19, height 5\' 7"  (1.702 m), weight 72.6 kg, SpO2 98 %.  PHYSICAL EXAMINATION:  GENERAL:  58 y.o.-year-old thin patient lying in the bed with no acute distress.  EYES: Pupils equal, round, reactive to light and accommodation. No scleral icterus. Extraocular muscles intact.  HEENT: Head atraumatic, normocephalic. Oropharynx and nasopharynx clear.  NECK:  Supple, no jugular venous distention. No thyroid enlargement, no tenderness.  LUNGS: Normal breath sounds bilaterally, no wheezing, rales,rhonchi or crepitation. No use of accessory muscles of respiration.  CARDIOVASCULAR: S1, S2 normal. No murmurs, rubs, or gallops.  ABDOMEN: Soft, nontender, distended. Bowel sounds present. No organomegaly or mass.  EXTREMITIES: No  pedal edema, cyanosis, or clubbing.  NEUROLOGIC: Cranial nerves II through XII are intact. Muscle strength 5/5 in all extremities. Sensation intact. Gait not checked.  PSYCHIATRIC: The patient is alert and oriented x 3.  SKIN: No obvious rash, lesion, or ulcer.   Physical Exam LABORATORY PANEL:   CBC Recent Labs  Lab 10/20/18 1723  WBC 9.2  HGB 13.8  HCT 39.4  PLT 146*   ------------------------------------------------------------------------------------------------------------------  Chemistries  Recent Labs  Lab 10/20/18 1723  NA 130*  K 4.2  CL 96*  CO2 27  GLUCOSE 132*  BUN 18  CREATININE 0.86  CALCIUM 8.4*  AST 103*  ALT 62*  ALKPHOS 86  BILITOT 2.9*   ------------------------------------------------------------------------------------------------------------------  Cardiac Enzymes No results for input(s): TROPONINI in the last 168 hours. ------------------------------------------------------------------------------------------------------------------  RADIOLOGY:  No results found.  ASSESSMENT AND PLAN:   Active Problems:   Ascites  Ac Colan  is a 58 y.o. 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  2. Hepatocellular carcinoma -patient followed by Dr. Grayland Ormond. He is scheduled to have IR ablate hepatic lesion on August 18  3. Mild hyponatremia due to cirrhosis  4. DVT prophylaxis subcu Lovenox  5. Severe malnutrition-secondary to liver cirrhosis and hepatocellular  carcinoma Dietary supplements.  All the records are reviewed and case discussed with Care  Management/Social Workerr. Management plans discussed with the patient, family and they are in agreement.  CODE STATUS: Full code.  TOTAL TIME TAKING CARE OF THIS PATIENT: 35 minutes.     POSSIBLE D/C IN 1-2 DAYS, DEPENDING ON CLINICAL CONDITION.   Vaughan Basta M.D on 10/21/2018   Between 7am to 6pm - Pager - [phone]  After 6pm go to [email or web address] - password [email or web address] [facility]  Office  [phone]  CC: Primary care physician; Patient, No Pcp Per  Note: This dictation was prepared with Dragon dictation along with smaller phrase technology. Any transcriptional errors that result from this process are unintentional.

## 2018-10-22 ENCOUNTER — Observation Stay: Payer: Medicaid Other

## 2018-10-22 ENCOUNTER — Other Ambulatory Visit: Payer: Self-pay

## 2018-10-22 DIAGNOSIS — Z6825 Body mass index (BMI) 25.0-25.9, adult: Secondary | ICD-10-CM | POA: Diagnosis not present

## 2018-10-22 DIAGNOSIS — E43 Unspecified severe protein-calorie malnutrition: Secondary | ICD-10-CM | POA: Diagnosis present

## 2018-10-22 DIAGNOSIS — C220 Liver cell carcinoma: Secondary | ICD-10-CM | POA: Diagnosis present

## 2018-10-22 DIAGNOSIS — R188 Other ascites: Secondary | ICD-10-CM | POA: Diagnosis present

## 2018-10-22 DIAGNOSIS — E871 Hypo-osmolality and hyponatremia: Secondary | ICD-10-CM | POA: Diagnosis present

## 2018-10-22 DIAGNOSIS — Z79899 Other long term (current) drug therapy: Secondary | ICD-10-CM | POA: Diagnosis not present

## 2018-10-22 DIAGNOSIS — Z20828 Contact with and (suspected) exposure to other viral communicable diseases: Secondary | ICD-10-CM | POA: Diagnosis present

## 2018-10-22 DIAGNOSIS — F17210 Nicotine dependence, cigarettes, uncomplicated: Secondary | ICD-10-CM | POA: Diagnosis present

## 2018-10-22 DIAGNOSIS — K7031 Alcoholic cirrhosis of liver with ascites: Secondary | ICD-10-CM | POA: Diagnosis present

## 2018-10-22 LAB — MAGNESIUM: Magnesium: 1.9 mg/dL (ref 1.7–2.4)

## 2018-10-22 MED ORDER — ALBUMIN HUMAN 25 % IV SOLN
25.0000 g | Freq: Once | INTRAVENOUS | Status: AC
  Administered 2018-10-22: 14:00:00 25 g via INTRAVENOUS
  Filled 2018-10-22: qty 100

## 2018-10-22 MED ORDER — CYCLOBENZAPRINE HCL 10 MG PO TABS
5.0000 mg | ORAL_TABLET | Freq: Three times a day (TID) | ORAL | Status: DC | PRN
  Administered 2018-10-22: 14:00:00 5 mg via ORAL
  Filled 2018-10-22: qty 1

## 2018-10-22 NOTE — Procedures (Signed)
Pre Procedural Dx: Symptomatic Ascites Post Procedural Dx: Same  Successful US guided paracentesis yielding 11.8 L of serous ascitic fluid.  EBL: None Complications: None immediate  Ronny Bacon, MD Pager #: [phone]

## 2018-10-22 NOTE — Discharge Summary (Signed)
[place] [facility] [place] NAME: David Brown    MR#:  [id number]  DATE OF BIRTH:  02-27-1961  DATE OF ADMISSION:  10/20/2018 ADMITTING PHYSICIAN: [name], MD  DATE OF DISCHARGE: 10/22/2018   PRIMARY CARE PHYSICIAN: Patient, No Pcp Per    ADMISSION DIAGNOSIS:  Ascites  DISCHARGE DIAGNOSIS:  Active Problems:   Ascites   SECONDARY DIAGNOSIS:   Past Medical History:  Diagnosis Date  . 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 Spoke to cancer center to arrange for outpatient paracentesis schedule.  2.Hepatocellular carcinoma -patient followed by Dr. Grayland Ormond. He is scheduled to have IR ablatehepatic lesion on August 18  3.Mild hyponatremia due to cirrhosis  4.DVT prophylaxis subcu Lovenox  5. Severe malnutrition-secondary to liver cirrhosis and hepatocellular carcinoma Dietary supplements.  DISCHARGE CONDITIONS:   Stable.  CONSULTS OBTAINED:    DRUG ALLERGIES:  No Known Allergies  DISCHARGE MEDICATIONS:   Allergies as of 10/22/2018   No Known Allergies     Medication List    TAKE these medications   furosemide 40 MG tablet Commonly known as: LASIX Take 1 tablet (40 mg total) by mouth 2 (two) times daily.   multivitamin with minerals Tabs tablet Take 1 tablet by mouth daily.   spironolactone 50 MG  tablet Commonly known as: Aldactone Take 1 tablet (50 mg total) by mouth 2 (two) times daily.   traMADol 50 MG tablet Commonly known as: ULTRAM Take 1 tablet (50 mg total) by mouth every 6 (six) hours as needed.        DISCHARGE INSTRUCTIONS:    Follow with Cancer center in 1 week.  If you experience worsening of your admission symptoms, develop shortness of breath, life threatening emergency, suicidal or homicidal thoughts you must seek medical attention immediately by calling 911 or calling your MD immediately  if symptoms less severe.  You Must read complete instructions/literature along with all the possible adverse reactions/side effects for all the Medicines you take and that have been prescribed to you. Take any new Medicines after you have completely understood and accept all the possible adverse reactions/side effects.   Please note  You were cared for by a hospitalist during your hospital stay. If you have any questions about your discharge medications or the care you received while you were in the hospital after you are discharged, you can call the unit and asked to speak with the hospitalist on call if the hospitalist that took care of you is not available. Once you are discharged, your primary care physician will handle any further medical issues. Please note that NO REFILLS for any discharge medications will be authorized once you are discharged, as it is imperative that you return to your primary care physician (or establish a relationship with a primary care physician if you do not have one) for your aftercare needs so that they can reassess your need for medications and monitor your lab values.    Today   CHIEF  COMPLAINT:   Chief Complaint  Patient presents with  . Ascites    HISTORY OF PRESENT ILLNESS:  Abdulrahim Siddiqi  is a 58 y.o. 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  Patient is supposed to get interventional radiology to ambulate his liver lesion on August 18. He is followed at the cancer center by Dr. Grayland Ormond. Patient is being admitted for recurrent ascites and for paracentesis.   VITAL SIGNS:  Blood pressure 111/79, pulse 91, temperature 98.2 F (36.8 C), temperature source Oral, resp. rate 19, height 5\' 7"  (1.702 m), weight 72.6 kg, SpO2 99 %.  I/O:    Intake/Output Summary (Last 24 hours) at 10/22/2018 1551 Last data filed at 10/22/2018 1403 Gross per 24 hour  Intake 240 ml  Output 200 ml  Net 40 ml    PHYSICAL EXAMINATION:  GENERAL:  58 y.o.-year-old thin patient lying in the bed with no acute distress.  EYES: Pupils equal, round, reactive to light and accommodation. No scleral icterus. Extraocular muscles intact.  HEENT: Head atraumatic, normocephalic. Oropharynx and nasopharynx clear.  NECK:  Supple, no jugular venous distention. No thyroid enlargement, no tenderness.  LUNGS: Normal breath sounds bilaterally, no wheezing, rales,rhonchi or crepitation. No use of accessory muscles of respiration.  CARDIOVASCULAR: S1, S2 normal. No murmurs, rubs, or gallops.  ABDOMEN: Soft, nontender, mild distended. Bowel sounds present. No organomegaly or mass.  EXTREMITIES: No pedal edema, cyanosis, or clubbing.  NEUROLOGIC: Cranial nerves II through XII are intact. Muscle strength 5/5 in all extremities. Sensation intact. Gait not checked.  PSYCHIATRIC: The patient is alert and oriented x 3.  SKIN: No obvious rash, lesion, or ulcer.   DATA REVIEW:   CBC Recent Labs  Lab 10/20/18 1723  WBC 9.2  HGB 13.8  HCT 39.4  PLT 146*    Chemistries  Recent Labs  Lab 10/20/18 1723 10/22/18 1515  NA 130*  --   K 4.2  --   CL 96*  --   CO2 27  --   GLUCOSE 132*  --   BUN 18  --   CREATININE 0.86  --    CALCIUM 8.4*  --   MG  --  1.9  AST 103*  --   ALT 62*  --   ALKPHOS 86  --   BILITOT 2.9*  --     Cardiac Enzymes No results for input(s): TROPONINI in the last 168 hours.  Microbiology Results  Results for orders placed or performed during the hospital encounter of 10/20/18  SARS Coronavirus 2 [facility] order, Performed in [place] hospital lab)     Status: None   Collection Time: 10/20/18  6:14 PM  Result Value Ref Range Status   SARS Coronavirus 2 NEGATIVE NEGATIVE Final    Comment: (NOTE) If result is NEGATIVE SARS-CoV-2 target nucleic acids are NOT DETECTED. The SARS-CoV-2 RNA is generally detectable in upper and lower  respiratory specimens during the acute phase of infection. The lowest  concentration of SARS-CoV-2 viral copies this assay can detect is 250  copies / mL. A negative result does not preclude SARS-CoV-2 infection  and should not be used as the sole basis for treatment or other  patient management decisions.  A negative result may occur with  improper specimen collection / handling, submission  of specimen other  than nasopharyngeal swab, presence of viral mutation(s) within the  areas targeted by this assay, and inadequate number of viral copies  (<250 copies / mL). A negative result must be combined with clinical  observations, patient history, and epidemiological information. If result is POSITIVE SARS-CoV-2 target nucleic acids are DETECTED. The SARS-CoV-2 RNA is generally detectable in upper and lower  respiratory specimens dur ing the acute phase of infection.  Positive  results are indicative of active infection with SARS-CoV-2.  Clinical  correlation with patient history and other diagnostic information is  necessary to determine patient infection status.  Positive results do  not rule out bacterial infection or co-infection with other viruses. If result is PRESUMPTIVE POSTIVE SARS-CoV-2 nucleic acids MAY BE PRESENT.   A presumptive positive  result was obtained on the submitted specimen  and confirmed on repeat testing.  While 2019 novel coronavirus  (SARS-CoV-2) nucleic acids may be present in the submitted sample  additional confirmatory testing may be necessary for epidemiological  and / or clinical management purposes  to differentiate between  SARS-CoV-2 and other Sarbecovirus currently known to infect humans.  If clinically indicated additional testing with an alternate test  methodology [phone]) is advised. The SARS-CoV-2 RNA is generally  detectable in upper and lower respiratory sp ecimens during the acute  phase of infection. The expected result is Negative. Fact Sheet for Patients:  StrictlyIdeas.no Fact Sheet for Healthcare Providers: BankingDealers.co.za This test is not yet approved or cleared by the [place] FDA and has been authorized for detection and/or diagnosis of SARS-CoV-2 by FDA under an Emergency Use Authorization (EUA).  This EUA will remain in effect (meaning this test can be used) for the duration of the COVID-19 declaration under Section 564(b)(1) of the Act, 21 U.S.C. section 360bbb-3(b)(1), unless the authorization is terminated or revoked sooner. Performed at [facility], [address] [place], [place] [address]     RADIOLOGY:  US Paracentesis  Result Date: 10/22/2018 INDICATION: History of cirrhosis and hepatocellular carcinoma with recurrent symptomatic intra-abdominal ascites. Please perform ultrasound-guided paracentesis for therapeutic purposes. EXAM: ULTRASOUND-GUIDED PARACENTESIS COMPARISON:  Multiple previous ultrasound-guided paracenteses, most recently 10/05/2018 yielding 7 L of peritoneal fluid MEDICATIONS: None. COMPLICATIONS: None immediate. TECHNIQUE: Informed written consent was obtained from the patient after a discussion of the risks, benefits and alternatives to treatment. A timeout was performed prior to the  initiation of the procedure. Initial ultrasound scanning demonstrates a large amount of ascites within the right lower abdominal quadrant. The right lower abdomen was prepped and draped in the usual sterile fashion. 1% lidocaine with epinephrine was used for local anesthesia. An ultrasound image was saved for documentation purposed. An 8 Fr Safe-T-Centesis catheter was introduced. The paracentesis was performed. The catheter was removed and a dressing was applied. The patient tolerated the procedure well without immediate post procedural complication. FINDINGS: A total of approximately 11.8 liters of serous fluid was removed. IMPRESSION: Successful ultrasound-guided paracentesis yielding 11.8 liters of peritoneal fluid. Electronically Signed   By: Sandi Mariscal M.D.   On: 10/22/2018 11:43    EKG:   Orders placed or performed during the hospital encounter of 10/20/18  . ED EKG  . ED EKG  . EKG 12-Lead  . EKG 12-Lead      Management plans discussed with the patient, family and they are in agreement.  CODE STATUS:     Code Status Orders  (From admission, onward)         Start  Ordered   10/20/18 2123  Full code  Continuous     10/20/18 2122        Code Status History    Date Active Date Inactive Code Status Order ID Comments User Context   09/26/2018 0056 09/26/2018 2040 Full Code [id number]  Mayer Camel, NP ED   Advance Care Planning Activity      TOTAL TIME TAKING CARE OF THIS PATIENT: 35 minutes.    Vaughan Basta M.D on 10/22/2018 at 3:51 PM  Between 7am to 6pm - Pager - [phone]  After 6pm go to [email or web address] - password [email or web address] [place] [facility]  Office  [phone]  CC: Primary care physician; Patient, No Pcp Per   Note: This dictation was prepared with Dragon dictation along with smaller phrase technology. Any transcriptional errors that result from this process are unintentional.

## 2018-10-22 NOTE — Progress Notes (Signed)
Pt is being dc home, offers no concerns at this time. Dc education provided.

## 2018-10-22 NOTE — TOC Transition Note (Signed)
Transition of Care [facility]) - CM/SW Discharge Note   Patient Details  Name: David Brown MRN: [id number] Date of Birth: 11/14/1960  [facility]) CM/SW Contact:  Shelbie Hutching, RN Phone Number: 10/22/2018, 3:26 PM   Clinical Narrative:    Patient will be discharging home this afternoon.  Patient placed under observation yesterday for ascites.  Paracentesis completed today and 11.8 L removed.  Patient will be able to follow up outpatient for paracentesis from now on.  Patient will follow up with Dr. Allen Norris and Dr. Grayland Ormond.  Patient does not have insurance.  RNCM called and left a message with financial counselors to call patient about Medicaid and [facility].   Patient reports his sister helps him with his applications and financial information, patient's sister is Electrical engineer.  Patient is living with his brother and his nephew will provide transportation at discharge.  Patient gets his prescriptions from [facility].        Final next level of care: Home/Self Care Barriers to Discharge: Barriers Resolved   Patient Goals and CMS Choice Patient states their goals for this hospitalization and ongoing recovery are:: Has questions about Medicaid and charity care.      Discharge Placement                       Discharge Plan and Services In-house Referral: Financial Counselor Discharge Planning Services: CM Consult                                 Social Determinants of Health (SDOH) Interventions     Readmission Risk Interventions No flowsheet data found.

## 2018-10-23 ENCOUNTER — Encounter: Payer: Self-pay | Admitting: *Deleted

## 2018-10-23 ENCOUNTER — Other Ambulatory Visit: Payer: Self-pay

## 2018-10-23 ENCOUNTER — Ambulatory Visit
Admission: RE | Admit: 2018-10-23 | Discharge: 2018-10-23 | Disposition: A | Discharge status: Home / Self Care | Payer: Self-pay | Source: Ambulatory Visit | Attending: Oncology | Admitting: Oncology

## 2018-10-23 ENCOUNTER — Telehealth: Payer: Self-pay

## 2018-10-23 DIAGNOSIS — R160 Hepatomegaly, not elsewhere classified: Secondary | ICD-10-CM

## 2018-10-23 HISTORY — PX: IR RADIOLOGIST EVAL & MGMT: IMG5224
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
## 2018-10-23 NOTE — Telephone Encounter (Signed)
Called patient to let him know about his scheduled paracentesis (08/11,18,25/2020) at 12:30 PM and that [facility]. Patient understood and had no further questions.

## 2018-10-24 ENCOUNTER — Ambulatory Visit: Payer: Self-pay | Admitting: Pharmacy Technician

## 2018-10-24 DIAGNOSIS — Z79899 Other long term (current) drug therapy: Secondary | ICD-10-CM

## 2018-10-25 ENCOUNTER — Telehealth: Payer: Self-pay

## 2018-10-25 ENCOUNTER — Other Ambulatory Visit: Payer: Self-pay

## 2018-10-25 DIAGNOSIS — R188 Other ascites: Secondary | ICD-10-CM

## 2018-10-25 NOTE — Telephone Encounter (Signed)
Yes he can.  Today or tomorrow if possible.  Will need to set up standing orders for IR.

## 2018-10-25 NOTE — Telephone Encounter (Signed)
Called patient to let him know that he is scheduled to [facility] and have his [facility]acentesis. Patient agreed and had no further questions.

## 2018-10-25 NOTE — Telephone Encounter (Signed)
Patient called and left a message wanting to know if he could get his stomach flushed done today or tomorrow. I then called patient and he stated that he had been doing okay until yesterday morning. He stated that his abdomen was "big again". He wanted to know if there was any way that he could have a paracentesis done soon. I told him that I would let Dr. Grayland Ormond know and then I would call him back. Patient understood. Dr. Grayland Ormond, can you please advise.

## 2018-10-26 ENCOUNTER — Telehealth: Payer: Self-pay | Admitting: Pharmacy Technician

## 2018-10-26 ENCOUNTER — Ambulatory Visit
Admission: RE | Admit: 2018-10-26 | Discharge: 2018-10-26 | Disposition: A | Discharge status: Home / Self Care | Payer: Medicaid Other | Source: Ambulatory Visit | Attending: Oncology | Admitting: Oncology

## 2018-10-26 ENCOUNTER — Other Ambulatory Visit: Payer: Self-pay

## 2018-10-26 DIAGNOSIS — R188 Other ascites: Secondary | ICD-10-CM | POA: Insufficient documentation

## 2018-10-26 NOTE — Progress Notes (Signed)
Met with patient completed financial assistance application for [facility] due to recent hospital visit.  Patient agreed to be responsible for gathering financial information and forwarding to appropriate department in [facility].    Patient verbally agreed to all terms of the [facility] contract.    Patient approved to receive medication assistance at [facility] as long as eligibility criteria continue to be met.    Provided patient with Civil engineer, contracting based on his particular needs.    [facility]

## 2018-10-26 NOTE — Procedures (Signed)
Pre Procedural Dx: Symptomatic Ascites Post Procedural Dx: Same  Successful US guided paracentesis yielding 6.5 L of serous ascitic fluid.  EBL: None Complications: None immediate  Ronny Bacon, MD Pager #: [phone]

## 2018-10-26 NOTE — Telephone Encounter (Signed)
Patient approved to receive medication assistance at [facility] as long as eligibility criteria continue to be met.  [facility]

## 2018-10-30 ENCOUNTER — Ambulatory Visit
Admission: RE | Admit: 2018-10-30 | Discharge: 2018-10-30 | Disposition: A | Discharge status: Home / Self Care | Payer: Medicaid Other | Source: Ambulatory Visit | Attending: Oncology | Admitting: Oncology

## 2018-10-30 ENCOUNTER — Other Ambulatory Visit (HOSPITAL_COMMUNITY): Payer: Self-pay | Admitting: Interventional Radiology

## 2018-10-30 ENCOUNTER — Other Ambulatory Visit: Payer: Self-pay

## 2018-10-30 DIAGNOSIS — R160 Hepatomegaly, not elsewhere classified: Secondary | ICD-10-CM

## 2018-10-30 DIAGNOSIS — R188 Other ascites: Secondary | ICD-10-CM

## 2018-11-06 ENCOUNTER — Encounter: Payer: Self-pay | Admitting: Gastroenterology

## 2018-11-06 ENCOUNTER — Other Ambulatory Visit: Payer: Self-pay

## 2018-11-06 ENCOUNTER — Ambulatory Visit (INDEPENDENT_AMBULATORY_CARE_PROVIDER_SITE_OTHER): Payer: Self-pay | Admitting: Gastroenterology

## 2018-11-06 ENCOUNTER — Ambulatory Visit
Admission: RE | Admit: 2018-11-06 | Discharge: 2018-11-06 | Disposition: A | Discharge status: Home / Self Care | Payer: Medicaid Other | Source: Ambulatory Visit

## 2018-11-06 VITALS — BP 97/65 | HR 97 | Temp 98.1°F | Ht 67.0 in | Wt 139.6 lb

## 2018-11-06 DIAGNOSIS — R188 Other ascites: Secondary | ICD-10-CM

## 2018-11-06 DIAGNOSIS — R160 Hepatomegaly, not elsewhere classified: Secondary | ICD-10-CM

## 2018-11-06 DIAGNOSIS — K7031 Alcoholic cirrhosis of liver with ascites: Secondary | ICD-10-CM

## 2018-11-06 MED ORDER — TRAMADOL HCL 50 MG PO TABS: 50.0000 mg | ORAL_TABLET | Freq: Four times a day (QID) | ORAL | 2 refills | Status: DC | PRN

## 2018-11-06 NOTE — Procedures (Signed)
Pre Procedural Dx: History of cirrhosis and HCC with recurrent symptomatic ascites Post Procedural Dx: Same  Successful US guided paracentesis yielding 8.2 L of serous ascitic fluid.  EBL: None Complications: None immediate  Ronny Bacon, MD Pager #: [phone]

## 2018-11-06 NOTE — Progress Notes (Signed)
Primary Care Physician: Default, Provider, MD  Primary Gastroenterologist:  Dr. [name]  Chief Complaint  Patient presents with   follow up cirrhosis    HPI: David Brown is a 58 y.o. male here for follow-up after being discharged in the hospital.  The patient was found to have alcoholic cirrhosis with ascites and lesions on the liver.  The patient states that he is due to have these lesions ablated by radiology.  The patient continues to have ascites and has had multiple taps since being discharged from the hospital.  He continues to have abdominal pain.  He also reports that he has shoulder pain and is sleeping very poorly at night.  The patient has not been taking any pain medication although he was prescribed Toradol.  He states he is scared of getting addicted to anything. The patient denies any further alcohol use or abuse.  Current Outpatient Medications  Medication Sig Dispense Refill   furosemide (LASIX) 40 MG tablet Take 1 tablet (40 mg total) by mouth 2 (two) times daily. 60 tablet 3   Multiple Vitamin (MULTIVITAMIN WITH MINERALS) TABS tablet Take 1 tablet by mouth daily. 30 tablet 0   spironolactone (ALDACTONE) 50 MG tablet Take 1 tablet (50 mg total) by mouth 2 (two) times daily. 60 tablet 3   traMADol (ULTRAM) 50 MG tablet Take 1 tablet (50 mg total) by mouth every 6 (six) hours as needed. (Patient not taking: Reported on 10/20/2018) 60 tablet 0   No current facility-administered medications for this visit.     Allergies as of 11/06/2018   (No Known Allergies)    ROS:  General: Negative for anorexia, weight loss, fever, chills, fatigue, weakness. ENT: Negative for hoarseness, difficulty swallowing , nasal congestion. CV: Negative for chest pain, angina, palpitations, dyspnea on exertion, peripheral edema.  Respiratory: Negative for dyspnea at rest, dyspnea on exertion, cough, sputum, wheezing.  GI: See history of present illness. GU:  Negative for  dysuria, hematuria, urinary incontinence, urinary frequency, nocturnal urination.  Endo: Negative for unusual weight change.    Physical Examination:   BP 97/65 (BP Location: Right Arm)    Pulse 97    Temp 98.1 F (36.7 C) (Oral)    Ht 5\' 7"  (1.702 m)    Wt 139 lb 9.6 oz (63.3 kg)    BMI 21.86 kg/m   General: Well-nourished, well-developed in no acute distress.  Eyes: No icterus. Conjunctivae pink. Mouth: Oropharyngeal mucosa moist and pink , no lesions erythema or exudate. Lungs: Clear to auscultation bilaterally. Non-labored. Heart: Regular rate and rhythm, no murmurs rubs or gallops.  Abdomen: Bowel sounds are normal, diffusely tender, positive slight distention, no hepatosplenomegaly or masses, no abdominal bruits or hernia , no rebound or guarding.   Extremities: No lower extremity edema. No clubbing or deformities. Neuro: Alert and oriented x 3.  Grossly intact. Skin: Warm and dry, no jaundice.   Psych: Alert and cooperative, normal mood and affect.  Labs:    Imaging Studies: Mr Abdomen W Wo Contrast  Result Date: 10/12/2018 CLINICAL DATA:  Suspicious right liver lesion in this patient with cirrhosis. EXAM: MRI ABDOMEN WITHOUT AND WITH CONTRAST TECHNIQUE: Multiplanar multisequence MR imaging of the abdomen was performed both before and after the administration of intravenous contrast. CONTRAST:  7 cc Gadavist COMPARISON:  CT scan 09/25/2018 FINDINGS: Lower chest: Unremarkable. Hepatobiliary: 2.3 x 2.2 cm lesion is identified in the inferior right liver (segment VI). Lesion demonstrates restricted diffusion, T1 shortening on precontrast  imaging and subtle increased signal intensity on T2 imaging. After IV contrast administration, the lesion enhances diffusely on arterial phase postcontrast gradient imaging with rapid washout and a clear peripherally enhancing capsule (well demonstrated coronal T1 gradient postcontrast image 28 of series 19). Imaging features are compatible with LI-RADS  5, hepatocellular carcinoma. A 2nd lesion was questioned in segment VIII on previous CT but there is no lesion at this location by MRI. Gallbladder is distended.  No biliary dilatation. Pancreas: No focal mass lesion. No dilatation of the main duct. No intraparenchymal cyst. No peripancreatic edema. Spleen:  No splenomegaly. No focal mass lesion. Adrenals/Urinary Tract: No adrenal nodule or mass. Right kidney unremarkable. Tiny hypoenhancing lesions in the left kidney are too small to characterize but likely tiny cortical cysts. Stomach/Bowel: Stomach decompressed. No small bowel or colonic dilatation within the visualized abdomen Vascular/Lymphatic: Portal vein is patent without evidence for intraluminal filling defect. No filling defect evident in the intrahepatic portal veins or hepatic veins. No abdominal aortic aneurysm. No abdominal lymphadenopathy. Other:  Large volume ascites evident. Musculoskeletal: No abnormal marrow signal evident within the visualized bony anatomy. IMPRESSION: 1. 2.3 x 2.2 cm inferior right liver lesion is probably in segment VI, near the junction of V and VI, and has imaging features characteristic of LI-RADS 5. This is considered diagnostic for hepatocellular carcinoma. 2. No other focal liver lesion evident. 3. Cirrhotic morphology of the liver. 4. Large volume ascites. These results will be called to the ordering clinician or representative by the Radiologist Assistant, and communication documented in the PACS or zVision Dashboard. Electronically Signed   By: Misty Stanley M.D.   On: 10/12/2018 12:05   Nm Pet Image Initial (pi) Skull Base To Thigh  Result Date: 10/08/2018 CLINICAL DATA:  Initial treatment strategy for liver mass. EXAM: NUCLEAR MEDICINE PET SKULL BASE TO THIGH TECHNIQUE: 9.1 mCi F-18 FDG was injected intravenously. Full-ring PET imaging was performed from the skull base to thigh after the radiotracer. CT data was obtained and used for attenuation correction and  anatomic localization. Fasting blood glucose: 88 mg/dl COMPARISON:  Abdomen/pelvis CT 09/25/2018 FINDINGS: Mediastinal blood pool activity: SUV max 2.1 Liver activity: SUV max NA NECK: Small focus of hypermetabolism in the right neck without underlying lymphadenopathy is indeterminate. Incidental CT findings: none CHEST: No hypermetabolic mediastinal or hilar nodes. No suspicious pulmonary nodules on the CT scan. Incidental CT findings: Coronary artery calcification is evident. Atherosclerotic calcification is noted in the wall of the thoracic aorta. Bilateral gynecomastia. ABDOMEN/PELVIS: No abnormal hypermetabolic activity within the liver, pancreas, adrenal glands, or spleen. No hypermetabolic lymph nodes in the abdomen or pelvis. No discernible hypermetabolism associated with the right hepatic lesion evident on prior CT. Incidental CT findings: Nodular irregular hepatic contour compatible with cirrhosis. Moderate to large volume ascites. There is abdominal aortic atherosclerosis without aneurysm. SKELETON: No focal hypermetabolic activity to suggest skeletal metastasis. Incidental CT findings: None. IMPRESSION: 1. No hypermetabolism associated with the patient's suspicious right hepatic lesion. MRI may prove helpful to further evaluate. 2. Small focus of hypermetabolism in the right neck without underlying lymphadenopathy/etiology. Finding is indeterminate. 3. No suspicious or unexpected hypermetabolism in the chest, abdomen, or pelvis. 4. Cirrhotic liver morphology with moderate to large volume ascites. 5.  Aortic Atherosclerois (ICD10-170.0) Electronically Signed   By: Misty Stanley M.D.   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 Please perform ultrasound-guided paracentesis for  therapeutic purposes. EXAM: ULTRASOUND-GUIDED PARACENTESIS COMPARISON:  Multiple previous  ultrasound-guided paracenteses, most recently 10/30/2026 yielding 7.5 L of peritoneal fluid. MEDICATIONS: None. COMPLICATIONS: None immediate. TECHNIQUE: Informed written consent was obtained from the patient after a discussion of the risks, benefits and alternatives to treatment. A timeout was performed prior to the initiation of the procedure. Initial ultrasound scanning demonstrates a moderate to large amount of ascites within the right lower abdominal quadrant. The right lower abdomen was prepped and draped in the usual sterile fashion. 1% lidocaine with epinephrine was used for local anesthesia. An ultrasound image was saved for documentation purposed. An 8 Fr Safe-T-Centesis catheter was introduced. The paracentesis was performed. The catheter was removed and a dressing was applied. The patient tolerated the procedure well without immediate post procedural complication. FINDINGS: A total of approximately 8.2 liters of serous fluid was removed. IMPRESSION: Successful ultrasound-guided paracentesis yielding 8.2 liters of peritoneal fluid. Electronically Signed   By: Sandi Mariscal M.D.   On: 11/06/2018 14:08   US Paracentesis  Result Date: 10/30/2018 INDICATION: Ascites. EXAM: ULTRASOUND GUIDED  PARACENTESIS MEDICATIONS: None. COMPLICATIONS: None immediate. PROCEDURE: Informed written consent was obtained from the patient after a discussion of the risks, benefits and alternatives to treatment. A timeout was performed prior to the initiation of the procedure. Initial ultrasound scanning demonstrates a large amount of ascites within the right lower abdominal quadrant. The right lower abdomen was prepped and draped in the usual sterile fashion. 1% lidocaine with epinephrine was used for local anesthesia. Following this, a 8 French catheter was introduced. An ultrasound image was saved for documentation purposes. The paracentesis was performed. The catheter was removed and a dressing was applied. The patient  tolerated the procedure well without immediate post procedural complication. FINDINGS: A total of approximately 7.5 L of yellow fluid was removed. IMPRESSION: Successful ultrasound-guided paracentesis yielding 7.5 liters of peritoneal fluid. Electronically Signed   By: Marcello Moores  Register   On: 10/30/2018 14:46   US Paracentesis  Result Date: 10/26/2018 INDICATION: History of cirrhosis and hepatocellular carcinoma with recurrent symptomatic ascites. Please perform ultrasound-guided paracentesis for therapeutic purposes. EXAM: ULTRASOUND-GUIDED PARACENTESIS COMPARISON:  Multiple previous ultrasound-guided paracenteses, most recently 10/22/2018 yielding 11.8 L of peritoneal fluid. MEDICATIONS: None. COMPLICATIONS: None immediate. TECHNIQUE: Informed written consent was obtained from the patient after a discussion of the risks, benefits and alternatives to treatment. A timeout was performed prior to the initiation of the procedure. Initial ultrasound scanning demonstrates a moderate amount of ascites within the right lower abdominal quadrant. The right lower abdomen was prepped and draped in the usual sterile fashion. 1% lidocaine with epinephrine was used for local anesthesia. An ultrasound image was saved for documentation purposed. An 8 Fr Safe-T-Centesis catheter was introduced. The paracentesis was performed. The catheter was removed and a dressing was applied. The patient tolerated the procedure well without immediate post procedural complication. FINDINGS: A total of approximately 6.5 liters of serous fluid was removed. IMPRESSION: Successful ultrasound-guided paracentesis yielding 6.5 liters of peritoneal fluid. Electronically Signed   By: Sandi Mariscal M.D.   On: 10/26/2018 12:13   US Paracentesis  Result Date: 10/22/2018 INDICATION: History of cirrhosis and hepatocellular carcinoma with recurrent symptomatic intra-abdominal ascites. Please perform ultrasound-guided paracentesis for therapeutic purposes.  EXAM: ULTRASOUND-GUIDED PARACENTESIS COMPARISON:  Multiple previous ultrasound-guided paracenteses, most recently 10/05/2018 yielding 7 L of peritoneal fluid MEDICATIONS: None. COMPLICATIONS: None immediate. TECHNIQUE: Informed written consent was obtained from the patient after a discussion of the risks, benefits and alternatives to treatment. A timeout  was performed prior to the initiation of the procedure. Initial ultrasound scanning demonstrates a large amount of ascites within the right lower abdominal quadrant. The right lower abdomen was prepped and draped in the usual sterile fashion. 1% lidocaine with epinephrine was used for local anesthesia. An ultrasound image was saved for documentation purposed. An 8 Fr Safe-T-Centesis catheter was introduced. The paracentesis was performed. The catheter was removed and a dressing was applied. The patient tolerated the procedure well without immediate post procedural complication. FINDINGS: A total of approximately 11.8 liters of serous fluid was removed. IMPRESSION: Successful ultrasound-guided paracentesis yielding 11.8 liters of peritoneal fluid. Electronically Signed   By: Sandi Mariscal M.D.   On: 10/22/2018 11:43   Ir Radiologist Eval & Mgmt  Result Date: 10/23/2018 Please refer to notes tab for details about interventional procedure. (Op Note)   Assessment and Plan:   MARCQUES WRIGHTSMAN is a 58 y.o. y/o male who comes in today with a history of alcoholic cirrhosis with lesions on the liver are suspicious of hepatocellular carcinoma.  The patient is going for interventional radiology treatment of these lesions.  The patient will have his medications adjusted since he is needing frequent paracentesis and his renal function last checked was good.  The patient will have his Aldactone increased to 100 mg in the morning and 50 mg at night in addition to his Lasix 40 mg once a day.  The patient will have his renal function tested today and again in a week and  adjustment of his medications will be determined at that time.  The patient has also been given a prescription for tramadol for his abdominal pain.  The patient and his wife have been explained the plan and agree with it.    [name], MD. Marval Regal   Note: This dictation was prepared with Dragon dictation along with smaller phrase technology. Any transcriptional errors that result from this process are unintentional.

## 2018-11-07 ENCOUNTER — Encounter: Payer: Self-pay | Admitting: Gastroenterology

## 2018-11-07 ENCOUNTER — Other Ambulatory Visit: Payer: Self-pay

## 2018-11-07 ENCOUNTER — Inpatient Hospital Stay
Admission: EM | Admit: 2018-11-07 | Discharge: 2018-11-13 | DRG: 641 | Disposition: A | Discharge status: Home / Self Care | Payer: Medicaid Other | Attending: Internal Medicine | Admitting: Internal Medicine

## 2018-11-07 ENCOUNTER — Encounter: Payer: Self-pay | Admitting: Emergency Medicine

## 2018-11-07 ENCOUNTER — Inpatient Hospital Stay: Payer: Self-pay

## 2018-11-07 DIAGNOSIS — K7031 Alcoholic cirrhosis of liver with ascites: Secondary | ICD-10-CM | POA: Diagnosis present

## 2018-11-07 DIAGNOSIS — D72829 Elevated white blood cell count, unspecified: Secondary | ICD-10-CM | POA: Diagnosis present

## 2018-11-07 DIAGNOSIS — F1020 Alcohol dependence, uncomplicated: Secondary | ICD-10-CM | POA: Diagnosis present

## 2018-11-07 DIAGNOSIS — Z20828 Contact with and (suspected) exposure to other viral communicable diseases: Secondary | ICD-10-CM | POA: Diagnosis present

## 2018-11-07 DIAGNOSIS — R188 Other ascites: Secondary | ICD-10-CM

## 2018-11-07 DIAGNOSIS — Z79899 Other long term (current) drug therapy: Secondary | ICD-10-CM | POA: Diagnosis not present

## 2018-11-07 DIAGNOSIS — F1290 Cannabis use, unspecified, uncomplicated: Secondary | ICD-10-CM | POA: Diagnosis present

## 2018-11-07 DIAGNOSIS — C220 Liver cell carcinoma: Secondary | ICD-10-CM | POA: Diagnosis present

## 2018-11-07 DIAGNOSIS — Z515 Encounter for palliative care: Secondary | ICD-10-CM

## 2018-11-07 DIAGNOSIS — E8809 Other disorders of plasma-protein metabolism, not elsewhere classified: Secondary | ICD-10-CM | POA: Diagnosis present

## 2018-11-07 DIAGNOSIS — F419 Anxiety disorder, unspecified: Secondary | ICD-10-CM | POA: Diagnosis present

## 2018-11-07 DIAGNOSIS — E861 Hypovolemia: Secondary | ICD-10-CM | POA: Diagnosis present

## 2018-11-07 DIAGNOSIS — K7011 Alcoholic hepatitis with ascites: Secondary | ICD-10-CM

## 2018-11-07 DIAGNOSIS — E871 Hypo-osmolality and hyponatremia: Principal | ICD-10-CM | POA: Diagnosis present

## 2018-11-07 DIAGNOSIS — F17210 Nicotine dependence, cigarettes, uncomplicated: Secondary | ICD-10-CM | POA: Diagnosis present

## 2018-11-07 LAB — PROTIME-INR
INR: 1.5 — ABNORMAL HIGH (ref 0.8–1.2)
Prothrombin Time: 17.6 seconds — ABNORMAL HIGH (ref 11.4–15.2)

## 2018-11-07 LAB — BASIC METABOLIC PANEL
Anion gap: 7 (ref 5–15)
Anion gap: 8 (ref 5–15)
BUN/Creatinine Ratio: 29 — ABNORMAL HIGH (ref 9–20)
BUN: 26 mg/dL — ABNORMAL HIGH (ref 6–24)
BUN: 27 mg/dL — ABNORMAL HIGH (ref 6–20)
BUN: 27 mg/dL — ABNORMAL HIGH (ref 6–20)
CO2: 21 mmol/L (ref 20–29)
CO2: 22 mmol/L (ref 22–32)
CO2: 21 mmol/L — ABNORMAL LOW (ref 22–32)
Calcium: 8.1 mg/dL — ABNORMAL LOW (ref 8.7–10.2)
Calcium: 7.7 mg/dL — ABNORMAL LOW (ref 8.9–10.3)
Calcium: 7.4 mg/dL — ABNORMAL LOW (ref 8.9–10.3)
Chloride: 84 mmol/L — ABNORMAL LOW (ref 96–106)
Chloride: 87 mmol/L — ABNORMAL LOW (ref 98–111)
Chloride: 88 mmol/L — ABNORMAL LOW (ref 98–111)
Creatinine, Ser: 0.89 mg/dL (ref 0.76–1.27)
Creatinine, Ser: 0.71 mg/dL (ref 0.61–1.24)
Creatinine, Ser: 0.7 mg/dL (ref 0.61–1.24)
GFR calc Af Amer: 110 mL/min/{1.73_m2} (ref >59)
GFR calc Af Amer: >60 mL/min (ref >60)
GFR calc Af Amer: >60 mL/min (ref >60)
GFR calc non Af Amer: 95 mL/min/{1.73_m2} (ref >59)
GFR calc non Af Amer: >60 mL/min (ref >60)
GFR calc non Af Amer: >60 mL/min (ref >60)
Glucose, Bld: 94 mg/dL (ref 70–99)
Glucose, Bld: 91 mg/dL (ref 70–99)
Glucose: 137 mg/dL — ABNORMAL HIGH (ref 65–99)
Potassium: 4.8 mmol/L (ref 3.5–5.2)
Potassium: 4.8 mmol/L (ref 3.5–5.1)
Potassium: 4.5 mmol/L (ref 3.5–5.1)
Sodium: 117 mmol/L — CL (ref 134–144)
Sodium: 116 mmol/L — CL (ref 135–145)
Sodium: 117 mmol/L — CL (ref 135–145)

## 2018-11-07 LAB — CBC
HCT: 41.5 % (ref 39.0–52.0)
Hemoglobin: 15.2 g/dL (ref 13.0–17.0)
MCH: 33.3 pg (ref 26.0–34.0)
MCHC: 36.6 g/dL — ABNORMAL HIGH (ref 30.0–36.0)
MCV: 90.8 fL (ref 80.0–100.0)
Platelets: 169 10*3/uL (ref 150–400)
RBC: 4.57 MIL/uL (ref 4.22–5.81)
RDW: 13.2 % (ref 11.5–15.5)
WBC: 15.1 10*3/uL — ABNORMAL HIGH (ref 4.0–10.5)
nRBC: 0 % (ref 0.0–0.2)

## 2018-11-07 LAB — SODIUM
Sodium: 118 mmol/L — CL (ref 135–145)
Sodium: 116 mmol/L — CL (ref 135–145)
Sodium: 118 mmol/L — CL (ref 135–145)
Sodium: 115 mmol/L — CL (ref 135–145)

## 2018-11-07 LAB — OSMOLALITY: Osmolality: 263 mOsm/kg — ABNORMAL LOW (ref 275–295)

## 2018-11-07 LAB — HEPATIC FUNCTION PANEL
ALT: 88 U/L — ABNORMAL HIGH (ref 0–44)
AST: 124 U/L — ABNORMAL HIGH (ref 15–41)
Albumin: 1.9 g/dL — ABNORMAL LOW (ref 3.5–5.0)
Alkaline Phosphatase: 142 U/L — ABNORMAL HIGH (ref 38–126)
Bilirubin, Direct: 0.9 mg/dL — ABNORMAL HIGH (ref 0.0–0.2)
Indirect Bilirubin: 1.6 mg/dL — ABNORMAL HIGH (ref 0.3–0.9)
Total Bilirubin: 2.5 mg/dL — ABNORMAL HIGH (ref 0.3–1.2)
Total Protein: 5.7 g/dL — ABNORMAL LOW (ref 6.5–8.1)

## 2018-11-07 LAB — TSH: TSH: 2.789 u[IU]/mL (ref 0.350–4.500)

## 2018-11-07 MED ORDER — FOLIC ACID 1 MG PO TABS
1.0000 mg | ORAL_TABLET | Freq: Every day | ORAL | Status: DC
  Administered 2018-11-07 – 2018-11-13 (×6): 1 mg via ORAL
  Filled 2018-11-07 (×5): qty 1

## 2018-11-07 MED ORDER — TRAMADOL HCL 50 MG PO TABS
50.0000 mg | ORAL_TABLET | Freq: Four times a day (QID) | ORAL | Status: DC | PRN
  Administered 2018-11-11: 50 mg via ORAL
  Filled 2018-11-07: qty 1

## 2018-11-07 MED ORDER — DIPHENHYDRAMINE HCL 25 MG PO CAPS
25.0000 mg | ORAL_CAPSULE | Freq: Every evening | ORAL | Status: DC | PRN
  Administered 2018-11-07 – 2018-11-12 (×4): 25 mg via ORAL
  Filled 2018-11-07 (×4): qty 1

## 2018-11-07 MED ORDER — TRAZODONE HCL 50 MG PO TABS: 25.0000 mg | ORAL_TABLET | Freq: Every evening | ORAL | Status: DC | PRN

## 2018-11-07 MED ORDER — ENOXAPARIN SODIUM 40 MG/0.4ML ~~LOC~~ SOLN
40.0000 mg | SUBCUTANEOUS | Status: DC
  Administered 2018-11-07 – 2018-11-12 (×4): 40 mg via SUBCUTANEOUS
  Filled 2018-11-07 (×5): qty 0.4

## 2018-11-07 MED ORDER — ACETAMINOPHEN 650 MG RE SUPP: 650.0000 mg | Freq: Four times a day (QID) | RECTAL | Status: DC | PRN

## 2018-11-07 MED ORDER — ADULT MULTIVITAMIN W/MINERALS CH
1.0000 | ORAL_TABLET | Freq: Every day | ORAL | Status: DC
  Administered 2018-11-07 – 2018-11-13 (×6): 1 via ORAL
  Filled 2018-11-07 (×5): qty 1

## 2018-11-07 MED ORDER — ONDANSETRON HCL 4 MG/2ML IJ SOLN
4.0000 mg | Freq: Four times a day (QID) | INTRAMUSCULAR | Status: DC | PRN
  Administered 2018-11-10: 20:00:00 4 mg via INTRAVENOUS
  Filled 2018-11-07: qty 2

## 2018-11-07 MED ORDER — ACETAMINOPHEN 325 MG PO TABS: 650.0000 mg | ORAL_TABLET | Freq: Four times a day (QID) | ORAL | Status: DC | PRN

## 2018-11-07 MED ORDER — VITAMIN B-1 100 MG PO TABS
100.0000 mg | ORAL_TABLET | Freq: Every day | ORAL | Status: DC
  Administered 2018-11-07 – 2018-11-13 (×6): 100 mg via ORAL
  Filled 2018-11-07 (×5): qty 1

## 2018-11-07 MED ORDER — ONDANSETRON HCL 4 MG PO TABS: 4.0000 mg | ORAL_TABLET | Freq: Four times a day (QID) | ORAL | Status: DC | PRN

## 2018-11-07 MED ORDER — SODIUM CHLORIDE 0.9 % IV SOLN
INTRAVENOUS | Status: DC
  Administered 2018-11-07 (×2): via INTRAVENOUS

## 2018-11-07 MED ORDER — ADULT MULTIVITAMIN W/MINERALS CH: 1.0000 | ORAL_TABLET | Freq: Every day | ORAL | Status: DC

## 2018-11-07 MED ORDER — MAGNESIUM HYDROXIDE 400 MG/5ML PO SUSP
30.0000 mL | Freq: Every day | ORAL | Status: DC | PRN
  Filled 2018-11-07: qty 30

## 2018-11-07 MED ORDER — SODIUM CHLORIDE 0.9 % IV SOLN
INTRAVENOUS | Status: DC
  Administered 2018-11-07: 06:00:00 via INTRAVENOUS

## 2018-11-07 MED ORDER — SODIUM CHLORIDE 0.9 % IV BOLUS
500.0000 mL | Freq: Once | INTRAVENOUS | Status: AC
  Administered 2018-11-07: 04:00:00 500 mL via INTRAVENOUS

## 2018-11-07 NOTE — ED Notes (Signed)
Patient is resting comfortably. 

## 2018-11-07 NOTE — ED Notes (Signed)
Pt states he took benadryl to attempt to sleep tonight when he was woken by a call from [facility] concerning low sodium levels. Pt denies any pain, dizziness, or other complaints. Pt is sore on the right side from the paracentesis yesterday. Pt is alert and oriented.

## 2018-11-07 NOTE — TOC Initial Note (Signed)
Transition of Care [facility]) - Initial/Assessment Note    Patient Details  Name: David Brown MRN: [id number] Date of Birth: [birth year]  [facility] CM/SW Contact:    Shelbie Hutching, RN Phone Number: 11/07/2018, 2:42 PM  Clinical Narrative:                 Patient admitted with hyponatremia.  Patient had outpatient large volume paracentesis yesterday - 8.2 L of fluid removed.  Patient was recently discharged from the hospital 8/3 after being admitted for ascites.  Patient has followed up outpatient with GI and with Dr. Grayland Ormond to have his paracentesis scheduled outpatient.   Patient does not have insurance at this time but Medicaid forms have been filled out with the financial counseling department during a previous admission.  Patient gets his prescriptions from [facility].  Family provides transportation.   RNCM will cont to follow for any discharge needs.   Expected Discharge Plan: Home/Self Care Barriers to Discharge: Continued Medical Work up   Patient Goals and CMS Choice        Expected Discharge Plan and Services Expected Discharge Plan: Home/Self Care       Living arrangements for the past 2 months: Single Family Home Expected Discharge Date: 11/10/18                                    Prior Living Arrangements/Services Living arrangements for the past 2 months: Single Family Home Lives with:: Relatives Patient language and need for interpreter reviewed:: No Do you feel safe going back to the place where you live?: Yes      Need for Family Participation in Patient Care: Yes (Comment)(liver cancer) Care giver support system in place?: Yes (comment)(sister)   Criminal Activity/Legal Involvement Pertinent to Current Situation/Hospitalization: No - Comment as needed  Activities of Daily Living Home Assistive Devices/Equipment: None ADL Screening (condition at time of admission) Patient's cognitive ability adequate to safely  complete daily activities?: Yes Is the patient deaf or have difficulty hearing?: No Does the patient have difficulty seeing, even when wearing glasses/contacts?: No Does the patient have difficulty concentrating, remembering, or making decisions?: No Patient able to express need for assistance with ADLs?: Yes Does the patient have difficulty dressing or bathing?: No Independently performs ADLs?: Yes (appropriate for developmental age) Does the patient have difficulty walking or climbing stairs?: No Weakness of Legs: None Weakness of Arms/Hands: None  Permission Sought/Granted                  Emotional Assessment Appearance:: Appears older than stated age Attitude/Demeanor/Rapport: Engaged Affect (typically observed): Accepting Orientation: : Oriented to Self, Oriented to Place, Oriented to  Time, Oriented to Situation Alcohol / Substance Use: Tobacco Use Psych Involvement: No (comment)  Admission diagnosis:  Hyponatremia [E87.1] Patient Active Problem List   Diagnosis Date Noted  . Hyponatremia 11/07/2018  . Hepatocellular carcinoma ([place]) 10/14/2018  . Ascites 09/26/2018  . Cirrhosis of liver with ascites ([place])    PCP:  Patient, No Pcp Per Pharmacy:   [facility] [address] [place] [address] Phone: [phone] Fax: [phone]  [place], [place] [address] [place] [address] Phone: [phone] Fax: [phone]     Social Determinants of Health (SDOH) Interventions    Readmission Risk  Interventions No flowsheet data found.

## 2018-11-07 NOTE — ED Triage Notes (Signed)
Pt arrived via POV with reports of low sodium level.  Pt has hx of liver CA and cirrhosis,   Pt had a paracentesis yesterday pt states they removed 8 bottles of fluid.
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
## 2018-11-07 NOTE — Progress Notes (Signed)
[facility] paged me today at 3 AM about a critical lab result of sodium of 100 on this patient. I reached him at the only number listed on his chart and spoke to his chart. Fraser Din his sister answered the phone and I requested to speak to him. She said she was at his appointment with Dr. Allen Norris today and has paperwork that all the information can be discussed with her, and helps provide his care and take him to his appointments. She is unable to  get him on the phone at the moment as the patient doesn't have another number and sleeps at his brothers house. I discussed with her that his sodium is critically low and he needs to go to the ER right away for further evaluation and IV medications. She verbalized understand and will be taking him to the ER now.

## 2018-11-07 NOTE — Progress Notes (Signed)
MD made aware of critical Na of116. No new orders at this time. Will continue to monitor.

## 2018-11-07 NOTE — ED Notes (Signed)
ED Provider at bedside. 
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
## 2018-11-07 NOTE — ED Notes (Signed)
Report given to Ashley, RN

## 2018-11-07 NOTE — ED Notes (Signed)
Pt given apple juice  

## 2018-11-07 NOTE — Progress Notes (Signed)
MD made aware of critical Na of 118.

## 2018-11-07 NOTE — Progress Notes (Signed)
MD made aware of critical Na of 118. No new orders given at this time. Will continue to monitor.
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
## 2018-11-07 NOTE — ED Triage Notes (Signed)
Pt to triage via w/c with no distress noted; reports was called PTA regarding abnormal sodium level

## 2018-11-07 NOTE — Progress Notes (Signed)
[facility] [facility] [place] NAME: David Brown    MR#:  [id number]  DATE OF BIRTH:  08/21/1960  SUBJECTIVE:  patient came in after outpatient labs to have serum sodium of 100 he is hemodynamically stable. Paracentesis was done yesterday. Patient states he is tired did not sleep well.  REVIEW OF SYSTEMS:   Review of Systems  Constitutional: Positive for malaise/fatigue. Negative for chills, fever and weight loss.  HENT: Negative for ear discharge, ear pain and nosebleeds.   Eyes: Negative for blurred vision, pain and discharge.  Respiratory: Negative for sputum production, shortness of breath, wheezing and stridor.   Cardiovascular: Negative for chest pain, palpitations, orthopnea and PND.  Gastrointestinal: Negative for abdominal pain, diarrhea, nausea and vomiting.  Genitourinary: Negative for frequency and urgency.  Musculoskeletal: Negative for back pain and joint pain.  Neurological: Positive for weakness. Negative for sensory change, speech change and focal weakness.  Psychiatric/Behavioral: Negative for depression and hallucinations. The patient is not nervous/anxious.    Tolerating Diet:yes Tolerating PT: not needed  DRUG ALLERGIES:  No Known Allergies  VITALS:  Blood pressure 94/69, pulse 94, temperature 97.7 F (36.5 C), temperature source Oral, resp. rate 18, height 5\' 7"  (1.702 m), weight 65.4 kg, SpO2 98 %.  PHYSICAL EXAMINATION:   Physical Exam  GENERAL:  58 y.o.-year-old patient lying in the bed with no acute distress.  EYES: Pupils equal, round, reactive to light and accommodation. No scleral icterus. Extraocular muscles intact.  HEENT: Head atraumatic, normocephalic. Oropharynx and nasopharynx clear.  NECK:  Supple, no jugular venous distention. No thyroid enlargement, no tenderness.  LUNGS: Normal breath sounds bilaterally, no wheezing, rales, rhonchi. No use of accessory muscles of respiration.   CARDIOVASCULAR: S1, S2 normal. No murmurs, rubs, or gallops.  ABDOMEN: Soft, nontender, distended+. Bowel sounds present. No organomegaly or mass. Fluid + EXTREMITIES: No cyanosis, clubbing or edema b/l.    NEUROLOGIC: Cranial nerves II through XII are intact. No focal Motor or sensory deficits b/l.   PSYCHIATRIC:  patient is alert and oriented x 3.  SKIN: No obvious rash, lesion, or ulcer.   LABORATORY PANEL:  CBC Recent Labs  Lab 11/07/18 0413  WBC 15.1*  HGB 15.2  HCT 41.5  PLT 169    Chemistries  Recent Labs  Lab 11/07/18 0508 11/07/18 0818  NA 117*  --   K 4.5  --   CL 88*  --   CO2 21*  --   GLUCOSE 91  --   BUN 27*  --   CREATININE 0.70  --   CALCIUM 7.4*  --   AST  --  124*  ALT  --  88*  ALKPHOS  --  142*  BILITOT  --  2.5*   Cardiac Enzymes No results for input(s): TROPONINI in the last 168 hours. 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 Please perform ultrasound-guided paracentesis for therapeutic purposes. EXAM: ULTRASOUND-GUIDED PARACENTESIS COMPARISON:  Multiple previous ultrasound-guided paracenteses, most recently 10/30/2026 yielding 7.5 L of peritoneal fluid. MEDICATIONS: None. COMPLICATIONS: None immediate. TECHNIQUE: Informed written consent was obtained from the patient after a discussion of the risks, benefits and alternatives to treatment. A timeout was performed prior to the initiation of the procedure. Initial ultrasound scanning demonstrates a moderate to large amount of ascites within the right lower abdominal quadrant. The right lower abdomen was prepped and draped in the usual sterile  fashion. 1% lidocaine with epinephrine was used for local anesthesia. An ultrasound image was saved for documentation purposed. An 8 Fr Safe-T-Centesis catheter was introduced. The paracentesis was performed. The catheter was removed and a  dressing was applied. The patient tolerated the procedure well without immediate post procedural complication. FINDINGS: A total of approximately 8.2 liters of serous fluid was removed. IMPRESSION: Successful ultrasound-guided paracentesis yielding 8.2 liters of peritoneal fluid. Electronically Signed   By: Sandi Mariscal M.D.   On: 11/06/2018 14:08   [place] Ekg Site Rite  Result Date: 11/07/2018 If Site Rite image not attached, placement could not be confirmed due to current cardiac rhythm.  ASSESSMENT AND PLAN:  David Brown is a 58 y.o. male with a history of recently diagnosed hepatocellular carcinoma complicated by recurrent ascites, alcoholic cirrhosis of the liver who presents at the request of his GI doctor for hyponatremia.   Basic labs were done and patient received a phone call at 3 AM this morning that his sodium was 100.  1. Severe hyponatremia -outpatient labs showed serum sodium of 100 -came into the ER sodium was 117 -he has history of liver cirrhosis with recurrent ascites and hepatocellular carcinoma causing hyponatremia -hold Lasix and spironolactone -spoke with Dr. Juleen China. Patient is not and oriented times three -will get PICC line placed and start on 3% hypertonic saline -monitor metabolic panel every four hours  2. Known history of alcoholic liver cirrhosis with recurrent ascites and recurrent paracentesis -paracentesis was done on 818 2020--- 7.5 L  3. Hepatocellular carcinoma -patient is supposed to get ambulation of the liver lesion by IR sometime next week  4. DVT prophylaxis lovenox    CODE STATUS: full   TOTAL TIME TAKING CARE OF THIS PATIENT: *4minutes.  >50% time spent on counselling and coordination of care  POSSIBLE D/C IN few** DAYS, DEPENDING ON CLINICAL CONDITION.  Note: This dictation was prepared with Dragon dictation along with smaller phrase technology. Any transcriptional errors that result from this process are unintentional.  David Brown  M.D on 11/07/2018 at 9:00 AM  Between 7am to 6pm - Pager - [phone]  After 6pm go to [email or web address] - password [email or web address]  [facility]  Office  [phone]  CC: Primary care physician; Patient, No Pcp PerPatient ID: David Brown, male   DOB: 1960/11/08, 58 y.o.   MRN: [id number]

## 2018-11-08 ENCOUNTER — Inpatient Hospital Stay: Payer: Self-pay

## 2018-11-08 DIAGNOSIS — F411 Generalized anxiety disorder: Secondary | ICD-10-CM

## 2018-11-08 DIAGNOSIS — F1014 Alcohol abuse with alcohol-induced mood disorder: Secondary | ICD-10-CM

## 2018-11-08 LAB — SODIUM
Sodium: 117 mmol/L — CL (ref 135–145)
Sodium: 117 mmol/L — CL (ref 135–145)
Sodium: 124 mmol/L — ABNORMAL LOW (ref 135–145)
Sodium: 119 mmol/L — CL (ref 135–145)
Sodium: 114 mmol/L — CL (ref 135–145)

## 2018-11-08 LAB — CREATININE, URINE, RANDOM: Creatinine, Urine: 161 mg/dL

## 2018-11-08 LAB — OSMOLALITY, URINE: Osmolality, Ur: 755 mOsm/kg (ref 300–900)

## 2018-11-08 LAB — NA AND K (SODIUM & POTASSIUM), RAND UR
Potassium Urine: 56 mmol/L
Sodium, Ur: <10 mmol/L

## 2018-11-08 MED ORDER — SODIUM CHLORIDE 3 % IV SOLN
INTRAVENOUS | Status: DC
  Administered 2018-11-08: 25 mL/h via INTRAVENOUS
  Filled 2018-11-08 (×3): qty 500

## 2018-11-08 MED ORDER — SODIUM CHLORIDE 3 % IV SOLN: INTRAVENOUS | Status: DC

## 2018-11-08 MED ORDER — SODIUM CHLORIDE 0.9% FLUSH
10.0000 mL | Freq: Two times a day (BID) | INTRAVENOUS | Status: DC
  Administered 2018-11-08 – 2018-11-12 (×10): 10 mL

## 2018-11-08 MED ORDER — SODIUM CHLORIDE 0.9% FLUSH: 10.0000 mL | INTRAVENOUS | Status: DC | PRN

## 2018-11-08 MED ORDER — SODIUM CHLORIDE 3 % IV SOLN
INTRAVENOUS | Status: DC
  Filled 2018-11-08: qty 500

## 2018-11-08 MED ORDER — SODIUM CHLORIDE 3 % IV SOLN
INTRAVENOUS | Status: DC
  Administered 2018-11-09: 25 mL/h via INTRAVENOUS
  Filled 2018-11-08 (×2): qty 500

## 2018-11-08 MED ORDER — ENSURE ENLIVE PO LIQD
237.0000 mL | ORAL | Status: DC
  Administered 2018-11-08 – 2018-11-10 (×2): 237 mL via ORAL

## 2018-11-08 NOTE — Progress Notes (Signed)
PHARMACY CONSULT NOTE - FOLLOW UP  Pharmacy Consult for Electrolyte Monitoring and Replacement   Recent Labs: Potassium (mmol/L)  Date Value  11/07/2018 4.5   Magnesium (mg/dL)  Date Value  10/22/2018 1.9   Calcium (mg/dL)  Date Value  11/07/2018 7.4 (L)   Albumin (g/dL)  Date Value  11/07/2018 1.9 (L)   Sodium (mmol/L)  Date Value  11/08/2018 114 (LL)  11/06/2018 117 (LL)   Assessment: 58 year old male with h/o alcohol abuse. Presented to the ED for low sodium drawn as outpatient. Na 117 on admission and has remained around this level despite receiving NS. Pharmacy consulted to follow patient as he will now be receiving hypertonic saline.  8/20 117 >> 124 >>119 >> 114  Goal of Therapy:  Rise in Na no greater than 4 mEq over 2 hours or 6 mEq over 4 hours  Plan:  Dr. Juleen China was notified of sodium level and confirmed to increase of NaCl 3% to 11ml/hr.   Na q4h. Pharmacy will continue to follow and assist MD as indicated.  Ena Dawley ,PharmD Clinical Pharmacist 11/08/2018 11:36 PM

## 2018-11-08 NOTE — Progress Notes (Signed)
PHARMACY CONSULT NOTE - FOLLOW UP  Pharmacy Consult for Electrolyte Monitoring and Replacement   Recent Labs: Potassium (mmol/L)  Date Value  11/07/2018 4.5   Magnesium (mg/dL)  Date Value  10/22/2018 1.9   Calcium (mg/dL)  Date Value  11/07/2018 7.4 (L)   Albumin (g/dL)  Date Value  11/07/2018 1.9 (L)   Sodium (mmol/L)  Date Value  11/08/2018 119 (LL)  11/06/2018 117 (LL)     Assessment: 58 year old male with h/o alcohol abuse. Presented to the ED for low sodium drawn as outpatient. Na 117 on admission and has remained around this level despite receiving NS. Pharmacy consulted to follow patient as he will now be receiving hypertonic saline.  8/20 117 >> 124 >>119  Goal of Therapy:  Rise in Na no greater than 4 mEq over 2 hours or 6 mEq over 4 hours  Plan:  Dr. Juleen China was notified of sodium level and confirmed to continue the current rate of 10 mL/hr of NaCl 3%.   Na q2h x 2 then q4h. Pharmacy will continue to follow and assist MD as indicated.  Rowland Lathe ,PharmD Clinical Pharmacist 11/08/2018 7:44 PM

## 2018-11-08 NOTE — Progress Notes (Signed)
PHARMACY CONSULT NOTE - FOLLOW UP  Pharmacy Consult for Electrolyte Monitoring and Replacement   Recent Labs: Potassium (mmol/L)  Date Value  11/07/2018 4.5   Magnesium (mg/dL)  Date Value  10/22/2018 1.9   Calcium (mg/dL)  Date Value  11/07/2018 7.4 (L)   Albumin (g/dL)  Date Value  11/07/2018 1.9 (L)   Sodium (mmol/L)  Date Value  11/08/2018 117 (LL)  11/06/2018 117 (LL)     Assessment: 58 year old male with h/o alcohol abuse. Presented to the ED for low sodium drawn as outpatient. Na 117 on admission and has remained around this level despite receiving NS. Pharmacy consulted to follow patient as he will now be receiving hypertonic saline.  Goal of Therapy:  Rise in Na no greater than 4 mEq over 2 hours or 6 mEq over 4 hours  Plan:  First Na level since starting hypertonic saline to be drawn within the next few minutes. Na q2h x 2 then q4h. Pharmacy will continue to follow and assist MD as indicated.  Tawnya Crook ,PharmD Clinical Pharmacist 11/08/2018 3:02 PM

## 2018-11-08 NOTE — Progress Notes (Signed)
[facility]  ROUNDING NOTE   Subjective:   No improvement with saline infusion. Started on 3% hypertonic saline at 59mL/hr. However sodium went from 117 to 124. Rate is now slowed to 29mL/hr.   Objective:  Vital signs in last 24 hours:  Temp:  [98.2 F (36.8 C)-98.4 F (36.9 C)] 98.2 F (36.8 C) (08/20 1549) Pulse Rate:  [81-89] 81 (08/20 1549) Resp:  [18] 18 (08/20 1549) BP: (91-103)/(58-69) 91/62 (08/20 1549) SpO2:  [100 %] 100 % (08/20 1549)  Weight change: 2.078 kg Filed Weights   11/07/18 0408 11/07/18 0833  Weight: 63.3 kg 65.4 kg    Intake/Output: I/O last 3 completed shifts: In: 1526.3 [P.O.:720; I.V.:306.3; IV Piggyback:500] Out: 400 [Urine:400]   Intake/Output this shift:  Total I/O In: 520.7 [P.O.:480; I.V.:40.7] Out: -   Physical Exam: General: NAD,   Head: Normocephalic, atraumatic. Moist oral mucosal membranes  Eyes: Anicteric, PERRL  Neck: Supple, trachea midline  Lungs:  Clear to auscultation  Heart: Regular rate and rhythm  Abdomen:  Soft, nontender, +ascites  Extremities: no peripheral edema.  Neurologic: Nonfocal, moving all four extremities  Skin: No lesions        Basic Metabolic Panel: Recent Labs  Lab 11/06/18 1555  11/07/18 0413 11/07/18 0508  11/07/18 1736 11/07/18 2207 11/08/18 0200 11/08/18 0557 11/08/18 1519  NA 117*   < > 116* 117*   < > 118* 115* 117* 117* 124*  K 4.8  --  4.8 4.5  --   --   --   --   --   --   CL 84*  --  87* 88*  --   --   --   --   --   --   CO2 21  --  22 21*  --   --   --   --   --   --   GLUCOSE 137*  --  94 91  --   --   --   --   --   --   BUN 26*  --  27* 27*  --   --   --   --   --   --   CREATININE 0.89  --  0.71 0.70  --   --   --   --   --   --   CALCIUM 8.1*  --  7.7* 7.4*  --   --   --   --   --   --    < > = values in this interval not displayed.    Liver Function Tests: Recent Labs  Lab 11/07/18 0818  AST 124*  ALT 88*  ALKPHOS 142*  BILITOT 2.5*  PROT 5.7*   ALBUMIN 1.9*   No results for input(s): LIPASE, AMYLASE in the last 168 hours. No results for input(s): AMMONIA in the last 168 hours.  CBC: Recent Labs  Lab 11/07/18 0413  WBC 15.1*  HGB 15.2  HCT 41.5  MCV 90.8  PLT 169    Cardiac Enzymes: No results for input(s): CKTOTAL, CKMB, CKMBINDEX, TROPONINI in the last 168 hours.  BNP: Invalid input(s): POCBNP  CBG: No results for input(s): GLUCAP in the last 168 hours.  Microbiology: Results for orders placed or performed during the hospital encounter of 10/20/18  SARS Coronavirus 2 [facility] order, Performed in [place] hospital lab)     Status: None   Collection Time: 10/20/18  6:14 PM  Result Value Ref Range Status  SARS Coronavirus 2 NEGATIVE NEGATIVE Final    Comment: (NOTE) If result is NEGATIVE SARS-CoV-2 target nucleic acids are NOT DETECTED. The SARS-CoV-2 RNA is generally detectable in upper and lower  respiratory specimens during the acute phase of infection. The lowest  concentration of SARS-CoV-2 viral copies this assay can detect is 250  copies / mL. A negative result does not preclude SARS-CoV-2 infection  and should not be used as the sole basis for treatment or other  patient management decisions.  A negative result may occur with  improper specimen collection / handling, submission of specimen other  than nasopharyngeal swab, presence of viral mutation(s) within the  areas targeted by this assay, and inadequate number of viral copies  (<250 copies / mL). A negative result must be combined with clinical  observations, patient history, and epidemiological information. If result is POSITIVE SARS-CoV-2 target nucleic acids are DETECTED. The SARS-CoV-2 RNA is generally detectable in upper and lower  respiratory specimens dur ing the acute phase of infection.  Positive  results are indicative of active infection with SARS-CoV-2.  Clinical  correlation with patient history and other diagnostic  information is  necessary to determine patient infection status.  Positive results do  not rule out bacterial infection or co-infection with other viruses. If result is PRESUMPTIVE POSTIVE SARS-CoV-2 nucleic acids MAY BE PRESENT.   A presumptive positive result was obtained on the submitted specimen  and confirmed on repeat testing.  While 2019 novel coronavirus  (SARS-CoV-2) nucleic acids may be present in the submitted sample  additional confirmatory testing may be necessary for epidemiological  and / or clinical management purposes  to differentiate between  SARS-CoV-2 and other Sarbecovirus currently known to infect humans.  If clinically indicated additional testing with an alternate test  methodology [phone]) is advised. The SARS-CoV-2 RNA is generally  detectable in upper and lower respiratory sp ecimens during the acute  phase of infection. The expected result is Negative. Fact Sheet for Patients:  StrictlyIdeas.no Fact Sheet for Healthcare Providers: BankingDealers.co.za This test is not yet approved or cleared by the [place] FDA and has been authorized for detection and/or diagnosis of SARS-CoV-2 by FDA under an Emergency Use Authorization (EUA).  This EUA will remain in effect (meaning this test can be used) for the duration of the COVID-19 declaration under Section 564(b)(1) of the Act, 21 U.S.C. section 360bbb-3(b)(1), unless the authorization is terminated or revoked sooner. Performed at [facility], [address] [place], [place] [address]     Coagulation Studies: Recent Labs    11/07/18 0818  LABPROT 17.6*  INR 1.5*    Urinalysis: No results for input(s): COLORURINE, LABSPEC, PHURINE, GLUCOSEU, HGBUR, BILIRUBINUR, KETONESUR, PROTEINUR, UROBILINOGEN, NITRITE, LEUKOCYTESUR in the last 72 hours.  Invalid input(s): APPERANCEUR    Imaging: [place] Ekg Site Rite  Result Date: 11/08/2018 If [facility] image not attached, placement could not be confirmed due to current cardiac rhythm.  [place] Ekg Site Rite  Result Date: 11/07/2018 If Site Rite image not attached, placement could not be confirmed due to current cardiac rhythm.    Medications:   . sodium chloride (hypertonic)     . enoxaparin (LOVENOX) injection  40 mg Subcutaneous Q24H  . feeding supplement (ENSURE ENLIVE)  237 mL Oral Q24H  . folic acid  1 mg Oral Daily  . multivitamin with minerals  1 tablet Oral Daily  . sodium chloride flush  10-40 mL Intracatheter Q12H  . thiamine  100 mg Oral  Daily   acetaminophen **OR** acetaminophen, diphenhydrAMINE, magnesium hydroxide, ondansetron **OR** ondansetron (ZOFRAN) IV, sodium chloride flush, traMADol  Assessment/ Plan:  Mr. JOCELYN LOWERY is a 58 y.o. white male with hepatic cirrhosis, ascites, alcoholism, hepatocellular carcinoma who was admitted to [facility] on 11/07/2018 for Hyponatremia [   1. Hyponatremia: 117 on admission. No improvement with 0.9% saline infusion.  Hypo-osmotic, hypo-volemic on examination. Suspect sodium deficit.  Recent increase in spironolactone.  Recent large volume paracentesis - 8.2 liters removed on 8/18.  - Discontinued spironolactone - Start fluid restriction - Hypertonic saline.   - Continue frequent sodium checks.    LOS: 1 Tonyetta Berko 8/20/20204:42 PM

## 2018-11-08 NOTE — Progress Notes (Signed)
Initial Nutrition Assessment  RD working remotely.  DOCUMENTATION CODES:   Not applicable  INTERVENTION:  Provide Ensure Enlive po once daily, each supplement provides 350 kcal and 20 grams of protein.  Patient would benefit from Ensure Enlive BID, but he is on a very low fluid restriction at this time. Will continue to monitor.  NUTRITION DIAGNOSIS:   Increased nutrient needs related to catabolic illness(hepatocellular carcinoma, cirrhosis) as evidenced by estimated needs.  GOAL:   Patient will meet greater than or equal to 90% of their needs  MONITOR:   PO intake, Supplement acceptance, Labs, Weight trends, I & O's  REASON FOR ASSESSMENT:   Malnutrition Screening Tool    ASSESSMENT:   58 year old male with PMHx of recently diagnosed hepatocellular carcinoma complicated by recurrent ascites, alcoholic cirrhosis of the liver admitted with severe hyponatremia.   Attempted to call patient over the phone but he was unable to answer. Patient appears to be eating well here. Per chart he is finishing 70-100% of his meals. Unable to tell what he is ordering while working remotely so unsure how well he is meeting his increased calorie/protein needs.  Weight appears to be trending down per chart. He was 75.4 kg on 10/15/2018. He is now 65.4 kg (144.18 lbs). He has lost 10 kg (13.3% body weight) over the past month, which is significant for time frame.   Medications reviewed and include: folic acid 1 mg daily, MVI daily, thiamine 100 mg daily.  Labs reviewed: Sodium 117.  Patient is at risk for malnutrition. Unable to determine if he meets criteria without full nutrition/weight history or completing NFPE. He has had significant weight loss.  NUTRITION - FOCUSED PHYSICAL EXAM:  Unable to complete at this time.  Diet Order:   Diet Order            Diet regular Room service appropriate? Yes; Fluid consistency: Thin; Fluid restriction: Other (see comments)  Diet effective now              EDUCATION NEEDS:   No education needs have been identified at this time  Skin:  Skin Assessment: Reviewed RN Assessment  Last BM:  11/06/2018 per chart  Height:   Ht Readings from Last 1 Encounters:  11/07/18 5\' 7"  (1.702 m)   Weight:   Wt Readings from Last 1 Encounters:  11/07/18 65.4 kg   Ideal Body Weight:  67.3 kg  BMI:  Body mass index is 22.58 kg/m.  Estimated Nutritional Needs:   Kcal:  1875-2160 (MSJ x 1.3-1.5)  Protein:  95-105 grams  Fluid:  per MD  Willey Blade, MS, RD, LDN Office: [phone] Pager: [phone] After Hours/Weekend Pager: [phone]

## 2018-11-08 NOTE — Progress Notes (Signed)
[place] [facility] [place] NAME: David Brown    MR#:  [id number]  DATE OF BIRTH:  1961-02-24  SUBJECTIVE:  patient complains of tightness and distention of his abdomen. He has several bottles of [employer] and lot of snack his sister brought. I told him to avoid it and have his sister take it back patient was informed of 1000 cc fluid restriction  REVIEW OF SYSTEMS:   Review of Systems  Constitutional: Positive for malaise/fatigue. Negative for chills, fever and weight loss.  HENT: Negative for ear discharge, ear pain and nosebleeds.   Eyes: Negative for blurred vision, pain and discharge.  Respiratory: Negative for sputum production, shortness of breath, wheezing and stridor.   Cardiovascular: Negative for chest pain, palpitations, orthopnea and PND.  Gastrointestinal: Negative for abdominal pain, diarrhea, nausea and vomiting.  Genitourinary: Negative for frequency and urgency.  Musculoskeletal: Negative for back pain and joint pain.  Neurological: Positive for weakness. Negative for sensory change, speech change and focal weakness.  Psychiatric/Behavioral: Negative for depression and hallucinations. The patient is not nervous/anxious.    Tolerating Diet:yes Tolerating PT: not needed  DRUG ALLERGIES:  No Known Allergies  VITALS:  Blood pressure 100/69, pulse 89, temperature 98.3 F (36.8 C), resp. rate 18, height 5\' 7"  (1.702 m), weight 65.4 kg, SpO2 100 %.  PHYSICAL EXAMINATION:   Physical Exam  GENERAL:  58 y.o.-year-old patient lying in the bed with no acute distress.  EYES: Pupils equal, round, reactive to light and accommodation. No scleral icterus. Extraocular muscles intact.  HEENT: Head atraumatic, normocephalic. Oropharynx and nasopharynx clear.  NECK:  Supple, no jugular venous distention. No thyroid enlargement, no tenderness.  LUNGS: Normal breath sounds bilaterally, no wheezing, rales, rhonchi. No use of  accessory muscles of respiration.  CARDIOVASCULAR: S1, S2 normal. No murmurs, rubs, or gallops.  ABDOMEN: Soft, nontender, distended+++. Bowel sounds present. No organomegaly or mass. Fluid + EXTREMITIES: No cyanosis, clubbing or edema b/l.    NEUROLOGIC: Cranial nerves II through XII are intact. No focal Motor or sensory deficits b/l.   PSYCHIATRIC:  patient is alert and oriented x 3.  SKIN: No obvious rash, lesion, or ulcer.   LABORATORY PANEL:  CBC Recent Labs  Lab 11/07/18 0413  WBC 15.1*  HGB 15.2  HCT 41.5  PLT 169    Chemistries  Recent Labs  Lab 11/07/18 0508 11/07/18 0818  11/08/18 0557  NA 117*  --    < > 117*  K 4.5  --   --   --   CL 88*  --   --   --   CO2 21*  --   --   --   GLUCOSE 91  --   --   --   BUN 27*  --   --   --   CREATININE 0.70  --   --   --   CALCIUM 7.4*  --   --   --   AST  --  124*  --   --   ALT  --  88*  --   --   ALKPHOS  --  142*  --   --   BILITOT  --  2.5*  --   --    < > = values in this interval not displayed.   Cardiac Enzymes No results for input(s): TROPONINI in the last 168 hours. RADIOLOGY:  [place] Ekg Site Rite  Result Date: 11/08/2018 If [employer] not  attached, placement could not be confirmed due to current cardiac rhythm.  [place] Ekg Site Rite  Result Date: 11/07/2018 If Site Rite image not attached, placement could not be confirmed due to current cardiac rhythm.  ASSESSMENT AND PLAN:  David Brown is a 58 y.o. male with a history of recently diagnosed hepatocellular carcinoma complicated by recurrent ascites, alcoholic cirrhosis of the liver who presents at the request of his GI doctor for hyponatremia.   Basic labs were done and patient received a phone call at 3 AM this morning that his sodium was 1117  1. Severe hyponatremia -came into the ER sodium was 117--118--115--117--IV 3% Hypertonic saline--BMP -he has history of liver cirrhosis with recurrent ascites and hepatocellular carcinoma causing  hyponatremia -hold Lasix and spironolactone -spoke with Dr. Juleen China. Patient is alert and  oriented times three -will get PICC line placed and start on 3% hypertonic saline -fluid restriction to 1000 mL  2. Known history of alcoholic liver cirrhosis with recurrent ascites and recurrent paracentesis -paracentesis was done on 8/18/ 2020--- 7.5 L -patient feels distended will schedule therapeutic paracentesis for tomorrow -patient follows [facility].I. Dr. Evangeline Gula wall as outpatient-- he gets outpatient weekly paracentesis  3. Hepatocellular carcinoma -patient is supposed to get ablation of the liver lesion by IR sometime next week  4. DVT prophylaxis lovenox   CODE STATUS: full   TOTAL TIME TAKING CARE OF THIS PATIENT: *44minutes.  >50% time spent on counselling and coordination of care  POSSIBLE D/C IN few** DAYS, DEPENDING ON CLINICAL CONDITION.  Note: This dictation was prepared with Dragon dictation along with smaller phrase technology. Any transcriptional errors that result from this process are unintentional.  [name] M.D on 11/08/2018 at 2:33 PM  Between 7am to 6pm - Pager - [phone]  After 6pm go to [email or web address] - password [email or web address]  [facility]  Office  [phone]  CC: Primary care physician; Patient, No Pcp PerPatient ID: David Brown, male   DOB: 07/06/1960, 58 y.o.   MRN: [id number]

## 2018-11-08 NOTE — Progress Notes (Addendum)
PHARMACY CONSULT NOTE - FOLLOW UP  Pharmacy Consult for Electrolyte Monitoring and Replacement   Recent Labs: Potassium (mmol/L)  Date Value  11/07/2018 4.5   Magnesium (mg/dL)  Date Value  10/22/2018 1.9   Calcium (mg/dL)  Date Value  11/07/2018 7.4 (L)   Albumin (g/dL)  Date Value  11/07/2018 1.9 (L)   Sodium (mmol/L)  Date Value  11/08/2018 124 (L)  11/06/2018 117 (LL)     Assessment: 58 year old male with h/o alcohol abuse. Presented to the ED for low sodium drawn as outpatient. Na 117 on admission and has remained around this level despite receiving NS. Pharmacy consulted to follow patient as he will now be receiving hypertonic saline.  8/20 117 >> 124   Goal of Therapy:  Rise in Na no greater than 4 mEq over 2 hours or 6 mEq over 4 hours  Plan:  I called and asked if NaCl 3% was stopped 2/2 significant rise in Na. I was informed by the nurse that the infusion was not stopped; Dr. Juleen China reduced rate to 10 mL/hr.   Na q2h x 2 then q4h. Pharmacy will continue to follow and assist MD as indicated.  Rowland Lathe ,PharmD Clinical Pharmacist 11/08/2018 4:54 PM

## 2018-11-08 NOTE — Progress Notes (Signed)
Peripherally Inserted Central Catheter/Midline Placement  The IV Nurse has discussed with the patient and/or persons authorized to consent for the patient, the purpose of this procedure and the potential benefits and risks involved with this procedure.  The benefits include less needle sticks, lab draws from the catheter, and the patient may be discharged home with the catheter. Risks include, but not limited to, infection, bleeding, blood clot (thrombus formation), and puncture of an artery; nerve damage and irregular heartbeat and possibility to perform a PICC exchange if needed/ordered by physician.  Alternatives to this procedure were also discussed.  Bard Power PICC patient education guide, fact sheet on infection prevention and patient information card has been provided to patient /or left at bedside.    PICC/Midline Placement Documentation  PICC Double Lumen 16/10/96 PICC Right Basilic 36 cm 0 cm (Active)  Indication for Insertion or Continuance of Line Administration of hyperosmolar/irritating solutions (i.e. TPN, Vancomycin, etc.) 11/08/18 1050  Exposed Catheter (cm) 0 cm 11/08/18 1050  Site Assessment Clean;Dry;Intact 11/08/18 1050  Lumen #1 Status Saline locked;Flushed;Blood return noted 11/08/18 1050  Lumen #2 Status Flushed;Saline locked;Blood return noted 11/08/18 1050  Dressing Type Transparent 11/08/18 1050  Dressing Status Dry;Clean;Intact;Antimicrobial disc in place 11/08/18 [place] checked and tightened;Lumen 1 tubing changed 11/08/18 1050  Line Adjustment (NICU/IV Team Only) Yes 11/08/18 [place] dressing 11/08/18 [place] Due 11/15/18 11/08/18 1050       Rolena Infante 11/08/2018, 10:51 AM
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
## 2018-11-09 ENCOUNTER — Inpatient Hospital Stay: Payer: Medicaid Other

## 2018-11-09 DIAGNOSIS — K7011 Alcoholic hepatitis with ascites: Secondary | ICD-10-CM

## 2018-11-09 DIAGNOSIS — Z515 Encounter for palliative care: Secondary | ICD-10-CM

## 2018-11-09 LAB — COMPREHENSIVE METABOLIC PANEL
ALT: 78 U/L — ABNORMAL HIGH (ref 0–44)
AST: 106 U/L — ABNORMAL HIGH (ref 15–41)
Albumin: 1.7 g/dL — ABNORMAL LOW (ref 3.5–5.0)
Alkaline Phosphatase: 126 U/L (ref 38–126)
Anion gap: 2 — ABNORMAL LOW (ref 5–15)
BUN: 23 mg/dL — ABNORMAL HIGH (ref 6–20)
CO2: 20 mmol/L — ABNORMAL LOW (ref 22–32)
Calcium: 6.9 mg/dL — ABNORMAL LOW (ref 8.9–10.3)
Chloride: 105 mmol/L (ref 98–111)
Creatinine, Ser: 0.5 mg/dL — ABNORMAL LOW (ref 0.61–1.24)
GFR calc Af Amer: >60 mL/min (ref >60)
GFR calc non Af Amer: >60 mL/min (ref >60)
Glucose, Bld: 87 mg/dL (ref 70–99)
Potassium: 4.6 mmol/L (ref 3.5–5.1)
Sodium: 127 mmol/L — ABNORMAL LOW (ref 135–145)
Total Bilirubin: 1.9 mg/dL — ABNORMAL HIGH (ref 0.3–1.2)
Total Protein: 5.5 g/dL — ABNORMAL LOW (ref 6.5–8.1)

## 2018-11-09 LAB — SODIUM
Sodium: 117 mmol/L — CL (ref 135–145)
Sodium: 125 mmol/L — ABNORMAL LOW (ref 135–145)
Sodium: 122 mmol/L — ABNORMAL LOW (ref 135–145)
Sodium: 121 mmol/L — ABNORMAL LOW (ref 135–145)

## 2018-11-09 LAB — PTT FACTOR INHIBITOR (MIXING STUDY): aPTT: 30.9 s — ABNORMAL HIGH (ref 22.9–30.2)

## 2018-11-09 LAB — NOVEL CORONAVIRUS, NAA (HOSP ORDER, SEND-OUT TO REF LAB; TAT 18-24 HRS): SARS-CoV-2, NAA: NOT DETECTED

## 2018-11-09 MED ORDER — SODIUM CHLORIDE 3 % IV SOLN
INTRAVENOUS | Status: DC
  Administered 2018-11-09: 20 mL/h via INTRAVENOUS
  Filled 2018-11-09: qty 500

## 2018-11-09 MED ORDER — ALBUMIN HUMAN 25 % IV SOLN
25.0000 g | Freq: Four times a day (QID) | INTRAVENOUS | Status: DC
  Administered 2018-11-09 – 2018-11-13 (×14): 25 g via INTRAVENOUS
  Filled 2018-11-09 (×19): qty 100

## 2018-11-09 MED ORDER — LORAZEPAM 1 MG PO TABS
1.0000 mg | ORAL_TABLET | Freq: Four times a day (QID) | ORAL | Status: DC | PRN
  Administered 2018-11-09 – 2018-11-12 (×5): 1 mg via ORAL
  Filled 2018-11-09 (×5): qty 1

## 2018-11-09 NOTE — Procedures (Signed)
Interventional Radiology Procedure:   Indications: Cirrhosis and ascites.   Procedure: US guided paracentesis  Findings: Removed 5400 ml from RLQ quadrant  Complications: None     EBL: Less than 10 ml   David Brown R. Anselm Pancoast, MD  Pager: [phone]

## 2018-11-09 NOTE — Progress Notes (Signed)
[facility]  ROUNDING NOTE   Subjective:   Hypertonic saline infusion. Sodium 127 - overcorrected.   Objective:  Vital signs in last 24 hours:  Temp:  [98.2 F (36.8 C)-98.6 F (37 C)] 98.3 F (36.8 C) (08/21 0524) Pulse Rate:  [81-103] 103 (08/21 1113) Resp:  [15-18] 15 (08/21 0524) BP: (91-131)/(62-78) 131/78 (08/21 1113) SpO2:  [99 %-100 %] 100 % (08/21 1113)  Weight change:  Filed Weights   11/07/18 0408 11/07/18 0833  Weight: 63.3 kg 65.4 kg    Intake/Output: I/O last 3 completed shifts: In: 760.7 [P.O.:720; I.V.:40.7] Out: 400 [Urine:400]   Intake/Output this shift:  Total I/O In: 480 [P.O.:480] Out: -   Physical Exam: General: NAD,   Head: Normocephalic, atraumatic. Moist oral mucosal membranes  Eyes: Anicteric, PERRL  Neck: Supple, trachea midline  Lungs:  Clear to auscultation  Heart: Regular rate and rhythm  Abdomen:  Soft, nontender, +ascites  Extremities: no peripheral edema.  Neurologic: Nonfocal, moving all four extremities  Skin: No lesions        Basic Metabolic Panel: Recent Labs  Lab 11/06/18 1555  11/07/18 0413 11/07/18 0508  11/08/18 1519 11/08/18 1858 11/08/18 2300 11/09/18 0255 11/09/18 0845  NA 117*   < > 116* 117*   < > 124* 119* 114* 117* 127*  K 4.8  --  4.8 4.5  --   --   --   --   --  4.6  CL 84*  --  87* 88*  --   --   --   --   --  105  CO2 21  --  22 21*  --   --   --   --   --  20*  GLUCOSE 137*  --  94 91  --   --   --   --   --  87  BUN 26*  --  27* 27*  --   --   --   --   --  23*  CREATININE 0.89  --  0.71 0.70  --   --   --   --   --  0.50*  CALCIUM 8.1*  --  7.7* 7.4*  --   --   --   --   --  6.9*   < > = values in this interval not displayed.    Liver Function Tests: Recent Labs  Lab 11/07/18 0818 11/09/18 0845  AST 124* 106*  ALT 88* 78*  ALKPHOS 142* 126  BILITOT 2.5* 1.9*  PROT 5.7* 5.5*  ALBUMIN 1.9* 1.7*   No results for input(s): LIPASE, AMYLASE in the last 168 hours. No  results for input(s): AMMONIA in the last 168 hours.  CBC: Recent Labs  Lab 11/07/18 0413  WBC 15.1*  HGB 15.2  HCT 41.5  MCV 90.8  PLT 169    Cardiac Enzymes: No results for input(s): CKTOTAL, CKMB, CKMBINDEX, TROPONINI in the last 168 hours.  BNP: Invalid input(s): POCBNP  CBG: No results for input(s): GLUCAP in the last 168 hours.  Microbiology: Results for orders placed or performed during the hospital encounter of 11/07/18  Novel Coronavirus, NAA (send-out to ref lab)     Status: None   Collection Time: 11/07/18  5:08 AM   Specimen: Nasopharyngeal Swab; Respiratory  Result Value Ref Range Status   SARS-CoV-2, NAA NOT DETECTED NOT DETECTED Final    Comment: (NOTE) This test was developed and its performance characteristics determined by [employer]. This  test has not been FDA cleared or approved. This test has been authorized by FDA under an Emergency Use Authorization (EUA). This test is only authorized for the duration of time the declaration that circumstances exist justifying the authorization of the emergency use of in vitro diagnostic tests for detection of SARS-CoV-2 virus and/or diagnosis of COVID-19 infection under section 564(b)(1) of the Act, 21 U.S.C. KA:123727), unless the authorization is terminated or revoked sooner. When diagnostic testing is negative, the possibility of a false negative result should be considered in the context of a patient's recent exposures and the presence of clinical signs and symptoms consistent with COVID-19. An individual without symptoms of COVID-19 and who is not shedding SARS-CoV-2 virus would expect to have a negative (not detected) result in this assay. Performed  At: [facility] [place], [place] [id number] Rush Farmer MD [id number]    [facility]  Final    Comment: Performed at [facility], [address] [place], [place] [address]     Coagulation Studies: Recent Labs    11/07/18 0818  LABPROT 17.6*  INR 1.5*    Urinalysis: No results for input(s): COLORURINE, LABSPEC, PHURINE, GLUCOSEU, HGBUR, BILIRUBINUR, KETONESUR, PROTEINUR, UROBILINOGEN, NITRITE, LEUKOCYTESUR in the last 72 hours.  Invalid input(s): APPERANCEUR    Imaging: [place] Ekg Site Rite  Result Date: 11/08/2018 If [facility] Fall image not attached, placement could not be confirmed due to current cardiac rhythm.    Medications:   . albumin human    . sodium chloride (hypertonic) 25 mL/hr (11/09/18 0011)   . enoxaparin (LOVENOX) injection  40 mg Subcutaneous Q24H  . feeding supplement (ENSURE ENLIVE)  237 mL Oral Q24H  . folic acid  1 mg Oral Daily  . multivitamin with minerals  1 tablet Oral Daily  . sodium chloride flush  10-40 mL Intracatheter Q12H  . thiamine  100 mg Oral Daily   acetaminophen **OR** acetaminophen, diphenhydrAMINE, magnesium hydroxide, ondansetron **OR** ondansetron (ZOFRAN) IV, sodium chloride flush, traMADol  Assessment/ Plan:  [name] is a 58 y.o. white male with hepatic cirrhosis, ascites, alcoholism, hepatocellular carcinoma who was admitted to [facility] on 11/07/2018 for Hyponatremia [   1. Hyponatremia: 117 on admission. No improvement with 0.9% saline infusion.  Hypo-osmotic, hypo-volemic on examination. Suspect sodium deficit.  Recent increase in spironolactone.  Recent large volume paracentesis - 8.2 liters removed on 8/18. Scheduled for another large volume paracentesis for today.  - Discontinued spironolactone - fluid restriction - Hypertonic saline - slow infusion rate to compensate for overcorrection. .   - Continue frequent sodium checks.    LOS: 2 [name] 8/21/202011:34 AM

## 2018-11-09 NOTE — Progress Notes (Signed)
Discussed with patient, he wants to go to [facility] to get his name written down on the transplant list.  He wanted a second opinion.  Told patient that he is still not medically stable to be discharged.  He still on 3% saline and sodium is low at 125.  Receiving IV albumin infusion.  Finished second paracentesis today. Patient states he feels good and wants to leave AMA.  Explained the consequences, he says he is aware.  Palliative care and the RN also tried to reason with him.  However he decided to leave against AMA.

## 2018-11-09 NOTE — Progress Notes (Signed)
[place] [facility] [place] NAME: David Brown    MR#:  [id number]  DATE OF BIRTH:  [birth year]  SUBJECTIVE:  CHIEF COMPLAINT:   Chief Complaint  Patient presents with  . Abnormal Lab   -Patient is very upset today, wants to go to [facility] to get on transplant list -Sodium is still at 125.  Remains on 3% saline.  Status post paracentesis and 5.4 L removed  REVIEW OF SYSTEMS:  Review of Systems  Constitutional: Negative for chills, fever and malaise/fatigue.  HENT: Negative for congestion, ear discharge, hearing loss and nosebleeds.   Eyes: Negative for blurred vision and double vision.  Respiratory: Negative for cough, shortness of breath and wheezing.   Cardiovascular: Negative for chest pain and palpitations.  Gastrointestinal: Positive for abdominal pain. Negative for constipation, diarrhea, nausea and vomiting.  Genitourinary: Negative for dysuria.  Musculoskeletal: Negative for myalgias.  Neurological: Negative for dizziness, focal weakness, seizures, weakness and headaches.  Psychiatric/Behavioral: Negative for depression.    DRUG ALLERGIES:  No Known Allergies  VITALS:  Blood pressure 131/78, pulse (!) (P) 103, temperature 98.3 F (36.8 C), temperature source Oral, resp. rate 15, height 5\' 7"  (1.702 m), weight 65.4 kg, SpO2 100 %.  PHYSICAL EXAMINATION:  Physical Exam   GENERAL:  58 y.o.-year-old thin built patient lying in the bed with no acute distress.  EYES: Pupils equal, round, reactive to light and accommodation. No scleral icterus. Extraocular muscles intact.  HEENT: Head atraumatic, normocephalic. Oropharynx and nasopharynx clear.  NECK:  Supple, no jugular venous distention. No thyroid enlargement, no tenderness.  LUNGS: Normal breath sounds bilaterally, no wheezing, rales,rhonchi or crepitation. No use of accessory muscles of respiration.  CARDIOVASCULAR: S1, S2 normal. No murmurs, rubs, or gallops.  ABDOMEN:  Soft, nontender, distended and firm. Bowel sounds present. No organomegaly or mass.  EXTREMITIES: No pedal edema, cyanosis, or clubbing.  NEUROLOGIC: Cranial nerves II through XII are intact. Muscle strength 5/5 in all extremities. Sensation intact. Gait not checked.  PSYCHIATRIC: The patient is alert and oriented x 3.  SKIN: No obvious rash, lesion, or ulcer.    LABORATORY PANEL:   CBC Recent Labs  Lab 11/07/18 0413  WBC 15.1*  HGB 15.2  HCT 41.5  PLT 169   ------------------------------------------------------------------------------------------------------------------  Chemistries  Recent Labs  Lab 11/09/18 0845 11/09/18 1235  NA 127* 125*  K 4.6  --   CL 105  --   CO2 20*  --   GLUCOSE 87  --   BUN 23*  --   CREATININE 0.50*  --   CALCIUM 6.9*  --   AST 106*  --   ALT 78*  --   ALKPHOS 126  --   BILITOT 1.9*  --    ------------------------------------------------------------------------------------------------------------------  Cardiac Enzymes No results for input(s): TROPONINI in the last 168 hours. ------------------------------------------------------------------------------------------------------------------  RADIOLOGY:  US Paracentesis  Result Date: 11/09/2018 INDICATION: 58 year old with ascites. EXAM: ULTRASOUND GUIDED PARACENTESIS MEDICATIONS: None. COMPLICATIONS: None immediate. PROCEDURE: Informed written consent was obtained from the patient after a discussion of the risks, benefits and alternatives to treatment. A timeout was performed prior to the initiation of the procedure. Initial ultrasound scanning demonstrates a large amount of ascites within the right lower abdominal quadrant. The right lower abdomen was prepped and draped in the usual sterile fashion. 1% lidocainewas used for local anesthesia. Following this, a 6 Fr Safe-T-Centesis catheter was introduced. An ultrasound image was saved for documentation purposes. The paracentesis was  performed. The catheter was removed and a dressing was applied. The patient tolerated the procedure well without immediate post procedural complication. FINDINGS: A total of approximately 5.4 L of yellow fluid was removed. IMPRESSION: Successful ultrasound-guided paracentesis yielding 5.4 liters of peritoneal fluid. Electronically Signed   By: Markus Daft M.D.   On: 11/09/2018 14:00   [place] Ekg Site Rite  Result Date: 11/08/2018 If Site Rite image not attached, placement could not be confirmed due to current cardiac rhythm.   EKG:   Orders placed or performed during the hospital encounter of 10/20/18  . ED EKG  . ED EKG  . EKG 12-Lead  . EKG 12-Lead    ASSESSMENT AND PLAN:   58 year old male with past medical history significant for alcoholic liver cirrhosis and hepatic lesions presented to the hospital from GI office secondary to critically low sodium.  1.  Hyponatremia-initially thought to be hypovolemic hyponatremia, however patient failed to respond to IV fluids. -Possible SIADH.  Started on 3% saline -Appreciate nephrology consult.  Sodium at 125 today  2.  Hypoalbuminemia-secondary to liver cirrhosis.  Receiving IV albumin infusion today  3.  Alcoholic liver cirrhosis-with significant ascites.  Patient had total of 13 L taken out this admission.  Had 8 L taken on 11/06/2018, and 5.4 L taken out today on 11/09/2018. -Diuretics are on hold given hyponatremia  4.  DVT prophylaxis-Lovenox  Patient is up and ambulatory.  Palliative care has been consulted to establish goals of care   All the records are reviewed and case discussed with Care Management/Social Workerr. Management plans discussed with the patient, family and they are in agreement.  CODE STATUS: Full code  TOTAL TIME TAKING CARE OF THIS PATIENT: 38 minutes.   POSSIBLE D/C IN 1-2 DAYS, DEPENDING ON CLINICAL CONDITION.   Gladstone Lighter M.D on 11/09/2018 at 3:13 PM  Between 7am to 6pm - Pager - [phone]   After 6pm go to [email or web address] - password [email or web address] [facility]  Office  [phone]  CC: Primary care physician; Patient, No Pcp Per

## 2018-11-09 NOTE — Progress Notes (Signed)
PHARMACY CONSULT NOTE - FOLLOW UP  Pharmacy Consult for Electrolyte Monitoring and Replacement   Recent Labs: Potassium (mmol/L)  Date Value  11/09/2018 4.6   Magnesium (mg/dL)  Date Value  10/22/2018 1.9   Calcium (mg/dL)  Date Value  11/09/2018 6.9 (L)   Albumin (g/dL)  Date Value  11/09/2018 1.7 (L)   Sodium (mmol/L)  Date Value  11/09/2018 121 (L)  11/06/2018 117 (LL)   Assessment: 58 year old male with h/o alcohol abuse. Presented to the ED for low sodium drawn as outpatient. Na 117 on admission and has remained around this level despite receiving NS. Pharmacy consulted to follow patient as he will now be receiving hypertonic saline.  Goal of Therapy:  Rise in Na no greater than 4 mEq over 2 hours or 6 mEq over 4 hours  Plan:  Na increased from 117 to 127 over 6 hours. Per Dr. Juleen China, rate decreased.   Patient's potassium down to 125. Na continuing to decline to 122 at 17:15. MD has ordered rate increase to 52ml/hr. Na is now 121, slightly down from 122, on rate of 27ml/hr.  Messaged Dr Juleen China > continue at 24ml/hr for now.  If Na down w/ next check then increase rate to 61ml/hr (per Dr Juleen China)  Hart Robinsons, PharmD 11/09/2018 11:04 PM

## 2018-11-09 NOTE — Progress Notes (Signed)
PHARMACY CONSULT NOTE - FOLLOW UP  Pharmacy Consult for Electrolyte Monitoring and Replacement   Recent Labs: Potassium (mmol/L)  Date Value  11/09/2018 4.6   Magnesium (mg/dL)  Date Value  10/22/2018 1.9   Calcium (mg/dL)  Date Value  11/09/2018 6.9 (L)   Albumin (g/dL)  Date Value  11/09/2018 1.7 (L)   Sodium (mmol/L)  Date Value  11/09/2018 125 (L)  11/06/2018 117 (LL)   Assessment: 58 year old male with h/o alcohol abuse. Presented to the ED for low sodium drawn as outpatient. Na 117 on admission and has remained around this level despite receiving NS. Pharmacy consulted to follow patient as he will now be receiving hypertonic saline.    Goal of Therapy:  Rise in Na no greater than 4 mEq over 2 hours or 6 mEq over 4 hours  Plan:  Na increased from 117 to 127 over 6 hours. Per Dr. Juleen China, rate decreased from 25 ml/hr to 15 ml/hr. Patient's potassium down to 125. Next check approximately 1600.  Tawnya Crook ,PharmD Clinical Pharmacist 11/09/2018 2:12 PM

## 2018-11-09 NOTE — Progress Notes (Signed)
Pt wanting to leave AMA. This nurse spoke with pt in length about benefits of staying versus leaving at this point. Pt was adamant that he needed to leave and get a second opinion. His things were packed and ready. He talked to his brother to come and pick him up. This nurse left the room after approximately 10 minutes to attend to another pt and returned to this pts room at approx. 1600. At that time pt appeared much calmer, stated that he had a lot going on and was afraid of what was going to happen to him. Comforted pt and spoke more on benefits of staying at this time. Pt asked "That doctor said I could leave on Sunday?" I said that if he stayed and he was in stable condition at that time then that was a possibility. Pt said he would "stay and get better". He still appeared to be anxious so this nurse asked MD for an order for antianxiety medications. Order for 1mg  ativan Q6h PRN given. Will continue to monitor.
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
## 2018-11-09 NOTE — Progress Notes (Signed)
PHARMACY CONSULT NOTE - FOLLOW UP  Pharmacy Consult for Electrolyte Monitoring and Replacement   Recent Labs: Potassium (mmol/L)  Date Value  11/09/2018 4.6   Magnesium (mg/dL)  Date Value  10/22/2018 1.9   Calcium (mg/dL)  Date Value  11/09/2018 6.9 (L)   Albumin (g/dL)  Date Value  11/09/2018 1.7 (L)   Sodium (mmol/L)  Date Value  11/09/2018 122 (L)  11/06/2018 117 (LL)   Assessment: 58 year old male with h/o alcohol abuse. Presented to the ED for low sodium drawn as outpatient. Na 117 on admission and has remained around this level despite receiving NS. Pharmacy consulted to follow patient as he will now be receiving hypertonic saline.    Goal of Therapy:  Rise in Na no greater than 4 mEq over 2 hours or 6 mEq over 4 hours  Plan:  Na increased from 117 to 127 over 6 hours. Per Dr. Juleen China, rate decreased from 25 ml/hr to 15 ml/hr. Patient's potassium down to 125. Na continuing to decline to 122 at 17:15. MD has ordered rate increase to 77ml/hr. Next check approximately 2000.  Paulina Fusi, PharmD, BCPS 11/09/2018 5:53 PM

## 2018-11-09 NOTE — Progress Notes (Signed)
PHARMACY CONSULT NOTE - FOLLOW UP  Pharmacy Consult for Electrolyte Monitoring and Replacement   Recent Labs: Potassium (mmol/L)  Date Value  11/07/2018 4.5   Magnesium (mg/dL)  Date Value  10/22/2018 1.9   Calcium (mg/dL)  Date Value  11/07/2018 7.4 (L)   Albumin (g/dL)  Date Value  11/07/2018 1.9 (L)   Sodium (mmol/L)  Date Value  11/09/2018 117 (LL)  11/06/2018 117 (LL)   Assessment: 58 year old male with h/o alcohol abuse. Presented to the ED for low sodium drawn as outpatient. Na 117 on admission and has remained around this level despite receiving NS. Pharmacy consulted to follow patient as he will now be receiving hypertonic saline.  Per Dr Juleen China NaCl #% was increased to 67ml/hr on 0820 at 2330.    8/20 117 >> 124 >>119 >> 114 >> 117  Goal of Therapy:  Rise in Na no greater than 4 mEq over 2 hours or 6 mEq over 4 hours  Plan:  0821 4am Na increase ~ 94mMols/L in 4 hrs, no adjustment needed at this time.  Continue NaCl 3% at 65ml/hr.   Na q4h. Pharmacy will continue to follow and assist MD as indicated.  Ena Dawley ,PharmD Clinical Pharmacist 11/09/2018 4:00 AM

## 2018-11-10 LAB — BASIC METABOLIC PANEL
Anion gap: 5 (ref 5–15)
BUN: 21 mg/dL — ABNORMAL HIGH (ref 6–20)
CO2: 21 mmol/L — ABNORMAL LOW (ref 22–32)
Calcium: 7.6 mg/dL — ABNORMAL LOW (ref 8.9–10.3)
Chloride: 97 mmol/L — ABNORMAL LOW (ref 98–111)
Creatinine, Ser: 0.6 mg/dL — ABNORMAL LOW (ref 0.61–1.24)
GFR calc Af Amer: >60 mL/min (ref >60)
GFR calc non Af Amer: >60 mL/min (ref >60)
Glucose, Bld: 90 mg/dL (ref 70–99)
Potassium: 5.1 mmol/L (ref 3.5–5.1)
Sodium: 123 mmol/L — ABNORMAL LOW (ref 135–145)

## 2018-11-10 LAB — SODIUM
Sodium: 122 mmol/L — ABNORMAL LOW (ref 135–145)
Sodium: 123 mmol/L — ABNORMAL LOW (ref 135–145)
Sodium: 124 mmol/L — ABNORMAL LOW (ref 135–145)
Sodium: 124 mmol/L — ABNORMAL LOW (ref 135–145)
Sodium: 123 mmol/L — ABNORMAL LOW (ref 135–145)

## 2018-11-10 MED ORDER — SODIUM CHLORIDE 3 % IV SOLN
20.0000 mL/h | INTRAVENOUS | Status: DC
  Filled 2018-11-10 (×3): qty 500

## 2018-11-10 MED ORDER — MIDODRINE HCL 5 MG PO TABS
2.5000 mg | ORAL_TABLET | Freq: Once | ORAL | Status: AC
  Administered 2018-11-10: 06:00:00 2.5 mg via ORAL
  Filled 2018-11-10: qty 1

## 2018-11-10 MED ORDER — SODIUM CHLORIDE 3 % IV SOLN
20.0000 mL/h | INTRAVENOUS | Status: DC
  Administered 2018-11-10 – 2018-11-12 (×2): 20 mL/h via INTRAVENOUS
  Filled 2018-11-10 (×2): qty 500

## 2018-11-10 MED ORDER — SODIUM CHLORIDE 0.9 % IV BOLUS
250.0000 mL | Freq: Once | INTRAVENOUS | Status: AC
  Administered 2018-11-10: 250 mL via INTRAVENOUS

## 2018-11-10 NOTE — Progress Notes (Signed)
PHARMACY CONSULT NOTE - FOLLOW UP  Pharmacy Consult for Electrolyte Monitoring and Replacement   Recent Labs: Potassium (mmol/L)  Date Value  11/10/2018 5.1   Magnesium (mg/dL)  Date Value  10/22/2018 1.9   Calcium (mg/dL)  Date Value  11/10/2018 7.6 (L)   Albumin (g/dL)  Date Value  11/09/2018 1.7 (L)   Sodium (mmol/L)  Date Value  11/10/2018 124 (L)  11/06/2018 117 (LL)   Assessment: 58 year old male with h/o alcohol abuse. Presented to the ED for low sodium drawn as outpatient. Na 117 on admission and has remained around this level despite receiving NS. Pharmacy consulted to follow patient as he will now be receiving hypertonic saline.  Goal of Therapy:  Rise in Na no greater than 4 mEq over 2 hours or 6 mEq over 4 hours  Plan:  Na increased from 117 to 127 over 6 hours. Per Dr. Juleen China, rate decreased.   Patient's potassium down to 125. Na continuing to decline to 122 at 17:15. MD has ordered rate increase to 19ml/hr. Na is now back to 122, slight increase from121 on rate of 11ml/hr.   8/22 @ 0610 Na = 123, >> Na slowly improving on rate of 58ml/hr. 8/22 ~ 09:00 Na = 123 8/22 ~ 14:00 Na = 124  Continue current rate unless Na goes back down. (previous d/w Dr Juleen China)  Olivia Canter, [facility] 11/10/2018 2:51 PM

## 2018-11-10 NOTE — Progress Notes (Signed)
PHARMACY CONSULT NOTE - FOLLOW UP  Pharmacy Consult for Electrolyte Monitoring and Replacement   Recent Labs: Potassium (mmol/L)  Date Value  11/09/2018 4.6   Magnesium (mg/dL)  Date Value  10/22/2018 1.9   Calcium (mg/dL)  Date Value  11/09/2018 6.9 (L)   Albumin (g/dL)  Date Value  11/09/2018 1.7 (L)   Sodium (mmol/L)  Date Value  11/10/2018 122 (L)  11/06/2018 117 (LL)   Assessment: 58 year old male with h/o alcohol abuse. Presented to the ED for low sodium drawn as outpatient. Na 117 on admission and has remained around this level despite receiving NS. Pharmacy consulted to follow patient as he will now be receiving hypertonic saline.    Goal of Therapy:  Rise in Na no greater than 4 mEq over 2 hours or 6 mEq over 4 hours  Plan:  Na increased from 117 to 127 over 6 hours. Per Dr. Juleen China, rate decreased.   Patient's potassium down to 125. Na continuing to decline to 122 at 17:15. MD has ordered rate increase to 33ml/hr. Na is now back to 122, slight increase from121 on rate of 18ml/hr.   Continue current rate unless Na goes back down. (previous d/w Dr Juleen China)  Hart Robinsons, PharmD 11/10/2018 2:21 AM

## 2018-11-10 NOTE — Progress Notes (Signed)
PHARMACY CONSULT NOTE - FOLLOW UP  Pharmacy Consult for Electrolyte Monitoring and Replacement   Recent Labs: Potassium (mmol/L)  Date Value  11/10/2018 5.1   Magnesium (mg/dL)  Date Value  10/22/2018 1.9   Calcium (mg/dL)  Date Value  11/10/2018 7.6 (L)   Albumin (g/dL)  Date Value  11/09/2018 1.7 (L)   Sodium (mmol/L)  Date Value  11/10/2018 123 (L)  11/06/2018 117 (LL)   Assessment: 58 year old male with h/o alcohol abuse. Presented to the ED for low sodium drawn as outpatient. Na 117 on admission and has remained around this level despite receiving NS. Pharmacy consulted to follow patient as he will now be receiving hypertonic saline.  Goal of Therapy:  Rise in Na no greater than 4 mEq over 2 hours or 6 mEq over 4 hours  Plan:  Na increased from 117 to 127 over 6 hours. Per Dr. Juleen China, rate decreased.   Patient's potassium down to 125. Na continuing to decline to 122 at 17:15. MD has ordered rate increase to 68ml/hr. Na is now back to 122, slight increase from121 on rate of 32ml/hr.   8/22 @ 0610 Na = 123, >> Na slowly improving on rate of 72ml/hr. 8/22 ~ 09:00 Na = 123 8/22 ~ 14:00 Na = 124 8/22@ 1826 Na= 124 8/22 @ 2228 Na = 123 however infusion was stopped today at 1129, unclear why.  Dr Juleen China notified and instructed me to restart 3% NaCl and continue Na checks as ordered.    Continue current rate unless Na goes back down. (previous d/w Dr Juleen China)  [place] Crane, Elayne Snare, [facility] 11/10/2018 10:56 PM

## 2018-11-10 NOTE — Progress Notes (Signed)
[place] [facility] [place] NAME: David Brown    MR#:  [id number]  DATE OF BIRTH:  Jun 24, 1960  SUBJECTIVE:  CHIEF COMPLAINT:   Chief Complaint  Patient presents with  . Abnormal Lab   -Sodium is still at 123.  Patient was upset yesterday about his care providers here, however apologizing today.  Agreed to stay in the hospital until he gets better.  REVIEW OF SYSTEMS:  Review of Systems  Constitutional: Negative for chills, fever and malaise/fatigue.  HENT: Negative for congestion, ear discharge, hearing loss and nosebleeds.   Eyes: Negative for blurred vision and double vision.  Respiratory: Negative for cough, shortness of breath and wheezing.   Cardiovascular: Negative for chest pain and palpitations.  Gastrointestinal: Negative for abdominal pain, constipation, diarrhea, nausea and vomiting.  Genitourinary: Negative for dysuria.  Musculoskeletal: Negative for myalgias.  Neurological: Negative for dizziness, focal weakness, seizures, weakness and headaches.  Psychiatric/Behavioral: Negative for depression.    DRUG ALLERGIES:  No Known Allergies  VITALS:  Blood pressure 108/76, pulse 99, temperature 98.7 F (37.1 C), temperature source Oral, resp. rate 14, height 5\' 7"  (1.702 m), weight 65.4 kg, SpO2 100 %.  PHYSICAL EXAMINATION:  Physical Exam   GENERAL:  58 y.o.-year-old thin built patient lying in the bed with no acute distress.  EYES: Pupils equal, round, reactive to light and accommodation. No scleral icterus. Extraocular muscles intact.  HEENT: Head atraumatic, normocephalic. Oropharynx and nasopharynx clear.  NECK:  Supple, no jugular venous distention. No thyroid enlargement, no tenderness.  LUNGS: Normal breath sounds bilaterally, no wheezing, rales,rhonchi or crepitation. No use of accessory muscles of respiration.  CARDIOVASCULAR: S1, S2 normal. No murmurs, rubs, or gallops.  ABDOMEN: Soft, nontender, distended and  firm. Bowel sounds present. No organomegaly or mass.  EXTREMITIES: No pedal edema, cyanosis, or clubbing.  NEUROLOGIC: Cranial nerves II through XII are intact. Muscle strength 5/5 in all extremities. Sensation intact. Gait not checked.  PSYCHIATRIC: The patient is alert and oriented x 3.  SKIN: No obvious rash, lesion, or ulcer.    LABORATORY PANEL:   CBC Recent Labs  Lab 11/07/18 0413  WBC 15.1*  HGB 15.2  HCT 41.5  PLT 169   ------------------------------------------------------------------------------------------------------------------  Chemistries  Recent Labs  Lab 11/09/18 0845  11/10/18 0610 11/10/18 0849  NA 127*   < > 123* 123*  K 4.6  --  5.1  --   CL 105  --  97*  --   CO2 20*  --  21*  --   GLUCOSE 87  --  90  --   BUN 23*  --  21*  --   CREATININE 0.50*  --  0.60*  --   CALCIUM 6.9*  --  7.6*  --   AST 106*  --   --   --   ALT 78*  --   --   --   ALKPHOS 126  --   --   --   BILITOT 1.9*  --   --   --    < > = values in this interval not displayed.   ------------------------------------------------------------------------------------------------------------------  Cardiac Enzymes No results for input(s): TROPONINI in the last 168 hours. ------------------------------------------------------------------------------------------------------------------  RADIOLOGY:  US Paracentesis  Result Date: 11/09/2018 INDICATION: 58 year old with ascites. EXAM: ULTRASOUND GUIDED PARACENTESIS MEDICATIONS: None. COMPLICATIONS: None immediate. PROCEDURE: Informed written consent was obtained from the patient after a discussion of the risks, benefits and alternatives to treatment. A timeout was  performed prior to the initiation of the procedure. Initial ultrasound scanning demonstrates a large amount of ascites within the right lower abdominal quadrant. The right lower abdomen was prepped and draped in the usual sterile fashion. 1% lidocainewas used for local anesthesia.  Following this, a 6 Fr Safe-T-Centesis catheter was introduced. An ultrasound image was saved for documentation purposes. The paracentesis was performed. The catheter was removed and a dressing was applied. The patient tolerated the procedure well without immediate post procedural complication. FINDINGS: A total of approximately 5.4 L of yellow fluid was removed. IMPRESSION: Successful ultrasound-guided paracentesis yielding 5.4 liters of peritoneal fluid. Electronically Signed   By: Markus Daft M.D.   On: 11/09/2018 14:00    EKG:   Orders placed or performed during the hospital encounter of 10/20/18  . ED EKG  . ED EKG  . EKG 12-Lead  . EKG 12-Lead    ASSESSMENT AND PLAN:   58 year old male with past medical history significant for alcoholic liver cirrhosis and hepatic lesions presented to the hospital from GI office secondary to critically low sodium.  1.  Hyponatremia-initially treated as  hypovolemic hyponatremia, however patient failed to respond to IV normal saline. -Possible SIADH.   on 3% saline-continue. -Appreciate nephrology consult.  Sodium at 123 today  2.  Hypoalbuminemia-secondary to liver cirrhosis.  Received IV albumin infusion- reorder for today  3.  Alcoholic liver cirrhosis-with significant ascites.  Patient had total of 13 L taken out this admission.  Had 8.2 L taken on 11/06/2018, and 5.4 L taken out today on 11/09/2018. -Diuretics are on hold given hyponatremia  4.  DVT prophylaxis-Lovenox  Patient is up and ambulatory.  Palliative care has been consulted to establish goals of care Patient is a full code   All the records are reviewed and case discussed with Care Management/Social Workerr. Management plans discussed with the patient, family and they are in agreement.  CODE STATUS: Full code  TOTAL TIME TAKING CARE OF THIS PATIENT: 38 minutes.   POSSIBLE D/C IN 1-2 DAYS, DEPENDING ON CLINICAL CONDITION.   Gladstone Lighter M.D on 11/10/2018 at 12:30 PM   Between 7am to 6pm - Pager - [phone]  After 6pm go to [email or web address] - password [email or web address] [facility]  Office  [phone]  CC: Primary care physician; Patient, No Pcp Per

## 2018-11-10 NOTE — Progress Notes (Signed)
[facility]  ROUNDING NOTE   Subjective:   Hypertonic saline infusion. Sodium 123  Patient upset about his care and providers. However now states he is sorry.   Objective:  Vital signs in last 24 hours:  Temp:  [98.5 F (36.9 C)-98.7 F (37.1 C)] 98.7 F (37.1 C) (08/22 1118) Pulse Rate:  [96-105] 99 (08/22 1118) Resp:  [14-15] 14 (08/22 1118) BP: (86-108)/(59-76) 108/76 (08/22 1118) SpO2:  [99 %-100 %] 100 % (08/22 1118)  Weight change:  Filed Weights   11/07/18 0408 11/07/18 0833  Weight: 63.3 kg 65.4 kg    Intake/Output: I/O last 3 completed shifts: In: 1126.3 [P.O.:480; I.V.:466.8; IV Piggyback:179.5] Out: -    Intake/Output this shift:  No intake/output data recorded.  Physical Exam: General: NAD,   Head: Normocephalic, atraumatic. Moist oral mucosal membranes  Eyes: Anicteric, PERRL  Neck: Supple, trachea midline  Lungs:  Clear to auscultation  Heart: Regular rate and rhythm  Abdomen:  Soft, nontender, +ascites  Extremities: no peripheral edema.  Neurologic: Nonfocal, moving all four extremities  Skin: No lesions        Basic Metabolic Panel: Recent Labs  Lab 11/06/18 1555  11/07/18 0413 11/07/18 0508  11/09/18 0845  11/09/18 1715 11/09/18 2138 11/10/18 0135 11/10/18 0610 11/10/18 0849  NA 117*   < > 116* 117*   < > 127*   < > 122* 121* 122* 123* 123*  K 4.8  --  4.8 4.5  --  4.6  --   --   --   --  5.1  --   CL 84*  --  87* 88*  --  105  --   --   --   --  97*  --   CO2 21  --  22 21*  --  20*  --   --   --   --  21*  --   GLUCOSE 137*  --  94 91  --  87  --   --   --   --  90  --   BUN 26*  --  27* 27*  --  23*  --   --   --   --  21*  --   CREATININE 0.89  --  0.71 0.70  --  0.50*  --   --   --   --  0.60*  --   CALCIUM 8.1*  --  7.7* 7.4*  --  6.9*  --   --   --   --  7.6*  --    < > = values in this interval not displayed.    Liver Function Tests: Recent Labs  Lab 11/07/18 0818 11/09/18 0845  AST 124* 106*  ALT 88*  78*  ALKPHOS 142* 126  BILITOT 2.5* 1.9*  PROT 5.7* 5.5*  ALBUMIN 1.9* 1.7*   No results for input(s): LIPASE, AMYLASE in the last 168 hours. No results for input(s): AMMONIA in the last 168 hours.  CBC: Recent Labs  Lab 11/07/18 0413  WBC 15.1*  HGB 15.2  HCT 41.5  MCV 90.8  PLT 169    Cardiac Enzymes: No results for input(s): CKTOTAL, CKMB, CKMBINDEX, TROPONINI in the last 168 hours.  BNP: Invalid input(s): POCBNP  CBG: No results for input(s): GLUCAP in the last 168 hours.  Microbiology: Results for orders placed or performed during the hospital encounter of 11/07/18  Novel Coronavirus, NAA (send-out to ref lab)     Status: None  Collection Time: 11/07/18  5:08 AM   Specimen: Nasopharyngeal Swab; Respiratory  Result Value Ref Range Status   SARS-CoV-2, NAA NOT DETECTED NOT DETECTED Final    Comment: (NOTE) This test was developed and its performance characteristics determined by [employer]. This test has not been FDA cleared or approved. This test has been authorized by FDA under an Emergency Use Authorization (EUA). This test is only authorized for the duration of time the declaration that circumstances exist justifying the authorization of the emergency use of in vitro diagnostic tests for detection of SARS-CoV-2 virus and/or diagnosis of COVID-19 infection under section 564(b)(1) of the Act, 21 U.S.C. KA:123727), unless the authorization is terminated or revoked sooner. When diagnostic testing is negative, the possibility of a false negative result should be considered in the context of a patient's recent exposures and the presence of clinical signs and symptoms consistent with COVID-19. An individual without symptoms of COVID-19 and who is not shedding SARS-CoV-2 virus would expect to have a negative (not detected) result in this assay. Performed  At: [facility] [place], [place] [id number] Rush Farmer MD  [id number]    [facility]  Final    Comment: Performed at [facility], [address] [place], [place] [address]    Coagulation Studies: No results for input(s): LABPROT, INR in the last 72 hours.  Urinalysis: No results for input(s): COLORURINE, LABSPEC, PHURINE, GLUCOSEU, HGBUR, BILIRUBINUR, KETONESUR, PROTEINUR, UROBILINOGEN, NITRITE, LEUKOCYTESUR in the last 72 hours.  Invalid input(s): APPERANCEUR    Imaging: US Paracentesis  Result Date: 11/09/2018 INDICATION: 58 year old with ascites. EXAM: ULTRASOUND GUIDED PARACENTESIS MEDICATIONS: None. COMPLICATIONS: None immediate. PROCEDURE: Informed written consent was obtained from the patient after a discussion of the risks, benefits and alternatives to treatment. A timeout was performed prior to the initiation of the procedure. Initial ultrasound scanning demonstrates a large amount of ascites within the right lower abdominal quadrant. The right lower abdomen was prepped and draped in the usual sterile fashion. 1% lidocainewas used for local anesthesia. Following this, a 6 Fr Safe-T-Centesis catheter was introduced. An ultrasound image was saved for documentation purposes. The paracentesis was performed. The catheter was removed and a dressing was applied. The patient tolerated the procedure well without immediate post procedural complication. FINDINGS: A total of approximately 5.4 L of yellow fluid was removed. IMPRESSION: Successful ultrasound-guided paracentesis yielding 5.4 liters of peritoneal fluid. Electronically Signed   By: Markus Daft M.D.   On: 11/09/2018 14:00     Medications:   . albumin human 25 g (11/10/18 0555)   . enoxaparin (LOVENOX) injection  40 mg Subcutaneous Q24H  . feeding supplement (ENSURE ENLIVE)  237 mL Oral Q24H  . folic acid  1 mg Oral Daily  . multivitamin with minerals  1 tablet Oral Daily  . sodium chloride flush  10-40 mL Intracatheter Q12H  . thiamine  100 mg Oral  Daily   acetaminophen **OR** acetaminophen, diphenhydrAMINE, LORazepam, magnesium hydroxide, ondansetron **OR** ondansetron (ZOFRAN) IV, sodium chloride flush, traMADol  Assessment/ Plan:  [name] is a 58 y.o. white male with hepatic cirrhosis, ascites, alcoholism, hepatocellular carcinoma who was admitted to [facility] on 11/07/2018 for Hyponatremia [   1. Hyponatremia: 117 on admission. No improvement with 0.9% saline infusion.  Hypo-osmotic, hypo-volemic on examination. Suspect sodium deficit.  Recent increase in spironolactone.  Recent large volume paracentesis - 8.2 liters removed on 8/18 and 5.4 liters on 8/21.   - Discontinued spironolactone - fluid restriction  1 liter/day - Hypertonic saline - rate 20/hr.   - Continue frequent sodium checks.    LOS: 3 Carnita Golob 8/22/202011:31 AM

## 2018-11-10 NOTE — Progress Notes (Signed)
PHARMACY CONSULT NOTE - FOLLOW UP  Pharmacy Consult for Electrolyte Monitoring and Replacement   Recent Labs: Potassium (mmol/L)  Date Value  11/10/2018 5.1   Magnesium (mg/dL)  Date Value  10/22/2018 1.9   Calcium (mg/dL)  Date Value  11/10/2018 7.6 (L)   Albumin (g/dL)  Date Value  11/09/2018 1.7 (L)   Sodium (mmol/L)  Date Value  11/10/2018 124 (L)  11/06/2018 117 (LL)   Assessment: 58 year old male with h/o alcohol abuse. Presented to the ED for low sodium drawn as outpatient. Na 117 on admission and has remained around this level despite receiving NS. Pharmacy consulted to follow patient as he will now be receiving hypertonic saline.  Goal of Therapy:  Rise in Na no greater than 4 mEq over 2 hours or 6 mEq over 4 hours  Plan:  Na increased from 117 to 127 over 6 hours. Per Dr. Juleen China, rate decreased.   Patient's potassium down to 125. Na continuing to decline to 122 at 17:15. MD has ordered rate increase to 50ml/hr. Na is now back to 122, slight increase from121 on rate of 58ml/hr.   8/22 @ 0610 Na = 123, >> Na slowly improving on rate of 52ml/hr. 8/22 ~ 09:00 Na = 123 8/22 ~ 14:00 Na = 124 8/22@ 1826 Na= 124  Continue current rate unless Na goes back down. (previous d/w Dr Juleen China)  Rowland Lathe, [facility] 11/10/2018 7:27 PM

## 2018-11-10 NOTE — Progress Notes (Signed)
PHARMACY CONSULT NOTE - FOLLOW UP  Pharmacy Consult for Electrolyte Monitoring and Replacement   Recent Labs: Potassium (mmol/L)  Date Value  11/10/2018 5.1   Magnesium (mg/dL)  Date Value  10/22/2018 1.9   Calcium (mg/dL)  Date Value  11/10/2018 7.6 (L)   Albumin (g/dL)  Date Value  11/09/2018 1.7 (L)   Sodium (mmol/L)  Date Value  11/10/2018 123 (L)  11/06/2018 117 (LL)   Assessment: 58 year old male with h/o alcohol abuse. Presented to the ED for low sodium drawn as outpatient. Na 117 on admission and has remained around this level despite receiving NS. Pharmacy consulted to follow patient as he will now be receiving hypertonic saline.  Goal of Therapy:  Rise in Na no greater than 4 mEq over 2 hours or 6 mEq over 4 hours  Plan:  Na increased from 117 to 127 over 6 hours. Per Dr. Juleen China, rate decreased.   Patient's potassium down to 125. Na continuing to decline to 122 at 17:15. MD has ordered rate increase to 37ml/hr. Na is now back to 122, slight increase from121 on rate of 55ml/hr.   8/22 @ 0610 Na = 123, >> Na slowly improving on rate of 67ml/hr.  Continue current rate unless Na goes back down. (previous d/w Dr Juleen China)  Hart Robinsons, PharmD 11/10/2018 6:50 AM

## 2018-11-11 LAB — COMPREHENSIVE METABOLIC PANEL
ALT: 48 U/L — ABNORMAL HIGH (ref 0–44)
AST: 70 U/L — ABNORMAL HIGH (ref 15–41)
Albumin: 3.7 g/dL (ref 3.5–5.0)
Alkaline Phosphatase: 96 U/L (ref 38–126)
Anion gap: 8 (ref 5–15)
BUN: 19 mg/dL (ref 6–20)
CO2: 18 mmol/L — ABNORMAL LOW (ref 22–32)
Calcium: 8.2 mg/dL — ABNORMAL LOW (ref 8.9–10.3)
Chloride: 98 mmol/L (ref 98–111)
Creatinine, Ser: 0.64 mg/dL (ref 0.61–1.24)
GFR calc Af Amer: >60 mL/min (ref >60)
GFR calc non Af Amer: >60 mL/min (ref >60)
Glucose, Bld: 95 mg/dL (ref 70–99)
Potassium: 4.3 mmol/L (ref 3.5–5.1)
Sodium: 124 mmol/L — ABNORMAL LOW (ref 135–145)
Total Bilirubin: 3.4 mg/dL — ABNORMAL HIGH (ref 0.3–1.2)
Total Protein: 5.7 g/dL — ABNORMAL LOW (ref 6.5–8.1)

## 2018-11-11 LAB — SODIUM
Sodium: 125 mmol/L — ABNORMAL LOW (ref 135–145)
Sodium: 124 mmol/L — ABNORMAL LOW (ref 135–145)
Sodium: 127 mmol/L — ABNORMAL LOW (ref 135–145)
Sodium: 125 mmol/L — ABNORMAL LOW (ref 135–145)

## 2018-11-11 NOTE — Plan of Care (Signed)

## 2018-11-11 NOTE — Progress Notes (Signed)
[place] [facility] [place] NAME: David Brown    MR#:  [id number]  DATE OF BIRTH:  01-24-1961  SUBJECTIVE:  CHIEF COMPLAINT:   Chief Complaint  Patient presents with   Abnormal Lab   -Sodium is  at 124.  - feels bored, remains on 3% saline.  REVIEW OF SYSTEMS:  Review of Systems  Constitutional: Negative for chills, fever and malaise/fatigue.  HENT: Negative for congestion, ear discharge, hearing loss and nosebleeds.   Eyes: Negative for blurred vision and double vision.  Respiratory: Negative for cough, shortness of breath and wheezing.   Cardiovascular: Negative for chest pain and palpitations.  Gastrointestinal: Negative for abdominal pain, constipation, diarrhea, nausea and vomiting.  Genitourinary: Negative for dysuria.  Musculoskeletal: Negative for myalgias.  Neurological: Negative for dizziness, focal weakness, seizures, weakness and headaches.  Psychiatric/Behavioral: Negative for depression.    DRUG ALLERGIES:  No Known Allergies  VITALS:  Blood pressure 105/76, pulse 98, temperature 99 F (37.2 C), temperature source Oral, resp. rate 15, height 5\' 7"  (1.702 m), weight 65.4 kg, SpO2 98 %.  PHYSICAL EXAMINATION:  Physical Exam   GENERAL:  58 y.o.-year-old thin built patient lying in the bed with no acute distress.  EYES: Pupils equal, round, reactive to light and accommodation. No scleral icterus. Extraocular muscles intact.  HEENT: Head atraumatic, normocephalic. Oropharynx and nasopharynx clear.  NECK:  Supple, no jugular venous distention. No thyroid enlargement, no tenderness.  LUNGS: Normal breath sounds bilaterally, no wheezing, rales,rhonchi or crepitation. No use of accessory muscles of respiration.  CARDIOVASCULAR: S1, S2 normal. No murmurs, rubs, or gallops.  ABDOMEN: Soft, nontender, distended and firm. Bowel sounds present. No organomegaly or mass.  EXTREMITIES: No pedal edema, cyanosis, or clubbing.    NEUROLOGIC: Cranial nerves II through XII are intact. Muscle strength 5/5 in all extremities. Sensation intact. Gait not checked.  PSYCHIATRIC: The patient is alert and oriented x 3.  SKIN: No obvious rash, lesion, or ulcer.    LABORATORY PANEL:   CBC Recent Labs  Lab 11/07/18 0413  WBC 15.1*  HGB 15.2  HCT 41.5  PLT 169   ------------------------------------------------------------------------------------------------------------------  Chemistries  Recent Labs  Lab 11/11/18 0918  NA 124*  K 4.3  CL 98  CO2 18*  GLUCOSE 95  BUN 19  CREATININE 0.64  CALCIUM 8.2*  AST 70*  ALT 48*  ALKPHOS 96  BILITOT 3.4*   ------------------------------------------------------------------------------------------------------------------  Cardiac Enzymes No results for input(s): TROPONINI in the last 168 hours. ------------------------------------------------------------------------------------------------------------------  RADIOLOGY:  US Paracentesis  Result Date: 11/09/2018 INDICATION: 58 year old with ascites. EXAM: ULTRASOUND GUIDED PARACENTESIS MEDICATIONS: None. COMPLICATIONS: None immediate. PROCEDURE: Informed written consent was obtained from the patient after a discussion of the risks, benefits and alternatives to treatment. A timeout was performed prior to the initiation of the procedure. Initial ultrasound scanning demonstrates a large amount of ascites within the right lower abdominal quadrant. The right lower abdomen was prepped and draped in the usual sterile fashion. 1% lidocainewas used for local anesthesia. Following this, a 6 Fr Safe-T-Centesis catheter was introduced. An ultrasound image was saved for documentation purposes. The paracentesis was performed. The catheter was removed and a dressing was applied. The patient tolerated the procedure well without immediate post procedural complication. FINDINGS: A total of approximately 5.4 L of yellow fluid was removed.  IMPRESSION: Successful ultrasound-guided paracentesis yielding 5.4 liters of peritoneal fluid. Electronically Signed   By: Markus Daft M.D.   On: 11/09/2018 14:00  EKG:   Orders placed or performed during the hospital encounter of 10/20/18   ED EKG   ED EKG   EKG 12-Lead   EKG 12-Lead    ASSESSMENT AND PLAN:   58 year old male with past medical history significant for alcoholic liver cirrhosis and hepatic lesions presented to the hospital from GI office secondary to critically low sodium.  1.  Hyponatremia-initially treated as  hypovolemic hyponatremia, however patient failed to respond to IV normal saline. -Possible SIADH.   on 3% saline-continue. -Appreciate nephrology consult.  Sodium at 124 today  2.  Hypoalbuminemia-secondary to liver cirrhosis.  Received IV albumin infusion   3.  Alcoholic liver cirrhosis-with significant ascites.  Patient had total of 13 L taken out this admission.  Had 8.2 L taken on 11/06/2018, and 5.4 L taken out today on 11/09/2018. -Diuretics are on hold given hyponatremia  4.  DVT prophylaxis-Lovenox  Patient is up and ambulatory.  Palliative care has been consulted to establish goals of care Patient is a full code   All the records are reviewed and case discussed with Care Management/Social Workerr. Management plans discussed with the patient, family and they are in agreement.  CODE STATUS: Full code  TOTAL TIME TAKING CARE OF THIS PATIENT: 28 minutes.   POSSIBLE D/C IN 1-2 DAYS, DEPENDING ON CLINICAL CONDITION.   Gladstone Lighter M.D on 11/11/2018 at 11:42 AM  Between 7am to 6pm - Pager - [phone]  After 6pm go to [email or web address] - password [email or web address] [facility]  Office  [phone]  CC: Primary care physician; Patient, No Pcp Per

## 2018-11-11 NOTE — Progress Notes (Signed)
PHARMACY CONSULT NOTE - FOLLOW UP  Pharmacy Consult for Electrolyte Monitoring and Replacement   Recent Labs: Potassium (mmol/L)  Date Value  11/11/2018 4.3   Magnesium (mg/dL)  Date Value  10/22/2018 1.9   Calcium (mg/dL)  Date Value  11/11/2018 8.2 (L)   Albumin (g/dL)  Date Value  11/11/2018 3.7   Sodium (mmol/L)  Date Value  11/11/2018 125 (L)  11/06/2018 117 (LL)   Assessment: 58 year old male with h/o alcohol abuse. Presented to the ED for low sodium drawn as outpatient. Na 117 on admission and has remained around this level despite receiving NS. Pharmacy consulted to follow patient as he will now be receiving hypertonic saline.  Goal of Therapy:  Rise in Na no greater than 4 mEq over 2 hours or 6 mEq over 4 hours  Plan:  8/23 1735 Na = 127 (increase of 3 mmol/ 3 mEq in ~ 3.5 hours). 8/23 2141 Na= 125 (decreae of 2 mmol/ 2 mEq in ~ 4 hours). Messaged Dr. Juleen China about whether to continue the current rate- per MD, patient will continue the current rate through the night and re-evaluate the rate tomorrow.   Continue to check Na q4hrs for now.    Rowland Lathe, [facility] 11/11/2018 10:07 PM

## 2018-11-11 NOTE — Progress Notes (Signed)
PHARMACY CONSULT NOTE - FOLLOW UP  Pharmacy Consult for Electrolyte Monitoring and Replacement   Recent Labs: Potassium (mmol/L)  Date Value  11/11/2018 4.3   Magnesium (mg/dL)  Date Value  10/22/2018 1.9   Calcium (mg/dL)  Date Value  11/11/2018 8.2 (L)   Albumin (g/dL)  Date Value  11/11/2018 3.7   Sodium (mmol/L)  Date Value  11/11/2018 127 (L)  11/06/2018 117 (LL)   Assessment: 58 year old male with h/o alcohol abuse. Presented to the ED for low sodium drawn as outpatient. Na 117 on admission and has remained around this level despite receiving NS. Pharmacy consulted to follow patient as he will now be receiving hypertonic saline.  Goal of Therapy:  Rise in Na no greater than 4 mEq over 2 hours or 6 mEq over 4 hours  Plan:  8/23 1735 Na = 127 (increase of 3 mmol/ 3 mEq in ~ 3.5 hours) .   Continue current rate unless Na goes back down. (previous d/w Dr Juleen China).  Continue to check Na q4hrs for now.    Rowland Lathe, [facility] 11/11/2018 6:28 PM

## 2018-11-11 NOTE — Progress Notes (Signed)
[facility]  ROUNDING NOTE   Subjective:   Na 124 (125)  Hypertonic infusion  Objective:  Vital signs in last 24 hours:  Temp:  [98.3 F (36.8 C)-99 F (37.2 C)] 99 F (37.2 C) (08/23 0403) Pulse Rate:  [98-102] 98 (08/23 0403) Resp:  [15] 15 (08/23 0403) BP: (91-105)/(63-76) 105/76 (08/23 0403) SpO2:  [98 %-100 %] 98 % (08/23 0403)  Weight change:  Filed Weights   11/07/18 0408 11/07/18 0833  Weight: 63.3 kg 65.4 kg    Intake/Output: I/O last 3 completed shifts: In: 706 [I.V.:348.5; IV Piggyback:357.5] Out: -    Intake/Output this shift:  No intake/output data recorded.  Physical Exam: General: NAD,   Head: Normocephalic, atraumatic. Moist oral mucosal membranes  Eyes: Anicteric, PERRL  Neck: Supple, trachea midline  Lungs:  Clear to auscultation  Heart: Regular rate and rhythm  Abdomen:  Soft, nontender, +ascites  Extremities: no peripheral edema.  Neurologic: Nonfocal, moving all four extremities  Skin: No lesions        Basic Metabolic Panel: Recent Labs  Lab 11/07/18 0413 11/07/18 0508  11/09/18 0845  11/10/18 0610  11/10/18 1408 11/10/18 1826 11/10/18 2228 11/11/18 0506 11/11/18 0918  NA 116* 117*   < > 127*   < > 123*   < > 124* 124* 123* 125* 124*  K 4.8 4.5  --  4.6  --  5.1  --   --   --   --   --  4.3  CL 87* 88*  --  105  --  97*  --   --   --   --   --  98  CO2 22 21*  --  20*  --  21*  --   --   --   --   --  18*  GLUCOSE 94 91  --  87  --  90  --   --   --   --   --  95  BUN 27* 27*  --  23*  --  21*  --   --   --   --   --  19  CREATININE 0.71 0.70  --  0.50*  --  0.60*  --   --   --   --   --  0.64  CALCIUM 7.7* 7.4*  --  6.9*  --  7.6*  --   --   --   --   --  8.2*   < > = values in this interval not displayed.    Liver Function Tests: Recent Labs  Lab 11/07/18 0818 11/09/18 0845 11/11/18 0918  AST 124* 106* 70*  ALT 88* 78* 48*  ALKPHOS 142* 126 96  BILITOT 2.5* 1.9* 3.4*  PROT 5.7* 5.5* 5.7*  ALBUMIN  1.9* 1.7* 3.7   No results for input(s): LIPASE, AMYLASE in the last 168 hours. No results for input(s): AMMONIA in the last 168 hours.  CBC: Recent Labs  Lab 11/07/18 0413  WBC 15.1*  HGB 15.2  HCT 41.5  MCV 90.8  PLT 169    Cardiac Enzymes: No results for input(s): CKTOTAL, CKMB, CKMBINDEX, TROPONINI in the last 168 hours.  BNP: Invalid input(s): POCBNP  CBG: No results for input(s): GLUCAP in the last 168 hours.  Microbiology: Results for orders placed or performed during the hospital encounter of 11/07/18  Novel Coronavirus, NAA (send-out to ref lab)     Status: None   Collection Time: 11/07/18  5:08 AM  Specimen: Nasopharyngeal Swab; Respiratory  Result Value Ref Range Status   SARS-CoV-2, NAA NOT DETECTED NOT DETECTED Final    Comment: (NOTE) This test was developed and its performance characteristics determined by [employer]. This test has not been FDA cleared or approved. This test has been authorized by FDA under an Emergency Use Authorization (EUA). This test is only authorized for the duration of time the declaration that circumstances exist justifying the authorization of the emergency use of in vitro diagnostic tests for detection of SARS-CoV-2 virus and/or diagnosis of COVID-19 infection under section 564(b)(1) of the Act, 21 U.S.C. KA:123727), unless the authorization is terminated or revoked sooner. When diagnostic testing is negative, the possibility of a false negative result should be considered in the context of a patient's recent exposures and the presence of clinical signs and symptoms consistent with COVID-19. An individual without symptoms of COVID-19 and who is not shedding SARS-CoV-2 virus would expect to have a negative (not detected) result in this assay. Performed  At: [facility] [place], [place] [id number] Rush Farmer MD [id number]    [place]  Final    Comment:  Performed at [facility], [address] [place], [place] [address]    Coagulation Studies: No results for input(s): LABPROT, INR in the last 72 hours.  Urinalysis: No results for input(s): COLORURINE, LABSPEC, PHURINE, GLUCOSEU, HGBUR, BILIRUBINUR, KETONESUR, PROTEINUR, UROBILINOGEN, NITRITE, LEUKOCYTESUR in the last 72 hours.  Invalid input(s): APPERANCEUR    Imaging: US Paracentesis  Result Date: 11/09/2018 INDICATION: 58 year old with ascites. EXAM: ULTRASOUND GUIDED PARACENTESIS MEDICATIONS: None. COMPLICATIONS: None immediate. PROCEDURE: Informed written consent was obtained from the patient after a discussion of the risks, benefits and alternatives to treatment. A timeout was performed prior to the initiation of the procedure. Initial ultrasound scanning demonstrates a large amount of ascites within the right lower abdominal quadrant. The right lower abdomen was prepped and draped in the usual sterile fashion. 1% lidocainewas used for local anesthesia. Following this, a 6 Fr Safe-T-Centesis catheter was introduced. An ultrasound image was saved for documentation purposes. The paracentesis was performed. The catheter was removed and a dressing was applied. The patient tolerated the procedure well without immediate post procedural complication. FINDINGS: A total of approximately 5.4 L of yellow fluid was removed. IMPRESSION: Successful ultrasound-guided paracentesis yielding 5.4 liters of peritoneal fluid. Electronically Signed   By: Markus Daft M.D.   On: 11/09/2018 14:00     Medications:   . albumin human 25 g (11/11/18 0812)  . sodium chloride (hypertonic) 20 mL/hr (11/10/18 2330)   . enoxaparin (LOVENOX) injection  40 mg Subcutaneous Q24H  . feeding supplement (ENSURE ENLIVE)  237 mL Oral Q24H  . folic acid  1 mg Oral Daily  . multivitamin with minerals  1 tablet Oral Daily  . sodium chloride flush  10-40 mL Intracatheter Q12H  . thiamine  100 mg Oral Daily    acetaminophen **OR** acetaminophen, diphenhydrAMINE, LORazepam, magnesium hydroxide, ondansetron **OR** ondansetron (ZOFRAN) IV, sodium chloride flush, traMADol  Assessment/ Plan:  [name] is a 58 y.o. white male with hepatic cirrhosis, ascites, alcoholism, hepatocellular carcinoma who was admitted to [facility] on 11/07/2018 for Hyponatremia  1. Hyponatremia: 117 on admission. No improvement with 0.9% saline infusion.  Baseline sodium of 130 on 10/20/2018. Hypo-osmotic, hypo-volemic on examination. Suspect sodium deficit.  Recent increase in spironolactone.  Recent large volume paracentesis - 8.2 liters removed on 8/18 and 5.4 liters on 8/21.   -  Discontinued spironolactone - fluid restriction 1 liter/day - Hypertonic saline - rate 25/hr.   - Continue frequent sodium checks.    LOS: 4 Jerold Yoss 8/23/202012:26 PM

## 2018-11-11 NOTE — Progress Notes (Signed)
PHARMACY CONSULT NOTE - FOLLOW UP  Pharmacy Consult for Electrolyte Monitoring and Replacement   Recent Labs: Potassium (mmol/L)  Date Value  11/11/2018 4.3   Magnesium (mg/dL)  Date Value  10/22/2018 1.9   Calcium (mg/dL)  Date Value  11/11/2018 8.2 (L)   Albumin (g/dL)  Date Value  11/11/2018 3.7   Sodium (mmol/L)  Date Value  11/11/2018 124 (L)  11/06/2018 117 (LL)   Assessment: 58 year old male with h/o alcohol abuse. Presented to the ED for low sodium drawn as outpatient. Na 117 on admission and has remained around this level despite receiving NS. Pharmacy consulted to follow patient as he will now be receiving hypertonic saline.  Goal of Therapy:  Rise in Na no greater than 4 mEq over 2 hours or 6 mEq over 4 hours  Plan:  8/23 ~ 1400  Na = 124.   Continue current rate unless Na goes back down. (previous d/w Dr Juleen China).  Continue to check Na q4hrs for now.    Olivia Canter, [facility] 11/11/2018 2:44 PM

## 2018-11-11 NOTE — Progress Notes (Signed)
PHARMACY CONSULT NOTE - FOLLOW UP  Pharmacy Consult for Electrolyte Monitoring and Replacement   Recent Labs: Potassium (mmol/L)  Date Value  11/11/2018 4.3   Magnesium (mg/dL)  Date Value  10/22/2018 1.9   Calcium (mg/dL)  Date Value  11/11/2018 8.2 (L)   Albumin (g/dL)  Date Value  11/11/2018 3.7   Sodium (mmol/L)  Date Value  11/11/2018 124 (L)  11/06/2018 117 (LL)   Assessment: 58 year old male with h/o alcohol abuse. Presented to the ED for low sodium drawn as outpatient. Na 117 on admission and has remained around this level despite receiving NS. Pharmacy consulted to follow patient as he will now be receiving hypertonic saline.  Goal of Therapy:  Rise in Na no greater than 4 mEq over 2 hours or 6 mEq over 4 hours  Plan:  8/23 ~ 0900  Na = 124.   Continue current rate unless Na goes back down. (previous d/w Dr Juleen China).  Continue to check Na q4hrs for now.    Olivia Canter, [facility] 11/11/2018 10:04 AM

## 2018-11-11 NOTE — Progress Notes (Signed)
PHARMACY CONSULT NOTE - FOLLOW UP  Pharmacy Consult for Electrolyte Monitoring and Replacement   Recent Labs: Potassium (mmol/L)  Date Value  11/10/2018 5.1   Magnesium (mg/dL)  Date Value  10/22/2018 1.9   Calcium (mg/dL)  Date Value  11/10/2018 7.6 (L)   Albumin (g/dL)  Date Value  11/09/2018 1.7 (L)   Sodium (mmol/L)  Date Value  11/11/2018 125 (L)  11/06/2018 117 (LL)   Assessment: 58 year old male with h/o alcohol abuse. Presented to the ED for low sodium drawn as outpatient. Na 117 on admission and has remained around this level despite receiving NS. Pharmacy consulted to follow patient as he will now be receiving hypertonic saline.  Goal of Therapy:  Rise in Na no greater than 4 mEq over 2 hours or 6 mEq over 4 hours  Plan:  Na increased from 117 to 127 over 6 hours. Per Dr. Juleen China, rate decreased.   Patient's potassium down to 125. Na continuing to decline to 122 at 17:15. MD has ordered rate increase to 32ml/hr. Na is now back to 122, slight increase from121 on rate of 15ml/hr.   8/22 @ 0610 Na = 123, >> Na slowly improving on rate of 3ml/hr. 8/22 ~ 09:00 Na = 123 8/22 ~ 14:00 Na = 124 8/22@ 1826 Na= 124 8/22 @ 2228 Na = 123 however infusion was stopped today at 1129, unclear why.  Dr Juleen China notified and instructed me to restart 3% NaCl and continue Na checks as ordered.   8/23 @ 0506 Na = 125, Na is improving slowly after restart of infusion.  Continue current rate unless Na goes back down. (previous d/w Dr Juleen China).  Continue to check Na q4hrs for now.    Ena Dawley, [facility] 11/11/2018 5:32 AM

## 2018-11-12 ENCOUNTER — Telehealth: Payer: Self-pay | Admitting: Gastroenterology

## 2018-11-12 LAB — SODIUM
Sodium: 126 mmol/L — ABNORMAL LOW (ref 135–145)
Sodium: 126 mmol/L — ABNORMAL LOW (ref 135–145)
Sodium: 127 mmol/L — ABNORMAL LOW (ref 135–145)

## 2018-11-12 NOTE — Progress Notes (Signed)
[place] [facility] [place] NAME: David Brown    MR#:  [id number]  DATE OF BIRTH:  October 16, 1960  SUBJECTIVE:  CHIEF COMPLAINT:   Chief Complaint  Patient presents with  . Abnormal Lab   -Sodium is  at 127, on 3% saline now at 46ml/hr - no complaints  REVIEW OF SYSTEMS:  Review of Systems  Constitutional: Negative for chills, fever and malaise/fatigue.  HENT: Negative for congestion, ear discharge, hearing loss and nosebleeds.   Eyes: Negative for blurred vision and double vision.  Respiratory: Negative for cough, shortness of breath and wheezing.   Cardiovascular: Negative for chest pain and palpitations.  Gastrointestinal: Negative for abdominal pain, constipation, diarrhea, nausea and vomiting.  Genitourinary: Negative for dysuria.  Musculoskeletal: Negative for myalgias.  Neurological: Negative for dizziness, focal weakness, seizures, weakness and headaches.  Psychiatric/Behavioral: Negative for depression.    DRUG ALLERGIES:  No Known Allergies  VITALS:  Blood pressure 111/78, pulse 93, temperature 98.5 F (36.9 C), temperature source Oral, resp. rate 19, height 5\' 7"  (1.702 m), weight 65.4 kg, SpO2 100 %.  PHYSICAL EXAMINATION:  Physical Exam   GENERAL:  58 y.o.-year-old thin built patient lying in the bed with no acute distress.  EYES: Pupils equal, round, reactive to light and accommodation. No scleral icterus. Extraocular muscles intact.  HEENT: Head atraumatic, normocephalic. Oropharynx and nasopharynx clear.  NECK:  Supple, no jugular venous distention. No thyroid enlargement, no tenderness.  LUNGS: Normal breath sounds bilaterally, no wheezing, rales,rhonchi or crepitation. No use of accessory muscles of respiration.  CARDIOVASCULAR: S1, S2 normal. No murmurs, rubs, or gallops.  ABDOMEN: Soft, nontender, distended and firm. Bowel sounds present. No organomegaly or mass.  EXTREMITIES: No pedal edema, cyanosis, or  clubbing.  NEUROLOGIC: Cranial nerves II through XII are intact. Muscle strength 5/5 in all extremities. Sensation intact. Gait not checked.  PSYCHIATRIC: The patient is alert and oriented x 3.  SKIN: No obvious rash, lesion, or ulcer.    LABORATORY PANEL:   CBC Recent Labs  Lab 11/07/18 0413  WBC 15.1*  HGB 15.2  HCT 41.5  PLT 169   ------------------------------------------------------------------------------------------------------------------  Chemistries  Recent Labs  Lab 11/11/18 0918  11/12/18 0758  NA 124*   < > 127*  K 4.3  --   --   CL 98  --   --   CO2 18*  --   --   GLUCOSE 95  --   --   BUN 19  --   --   CREATININE 0.64  --   --   CALCIUM 8.2*  --   --   AST 70*  --   --   ALT 48*  --   --   ALKPHOS 96  --   --   BILITOT 3.4*  --   --    < > = values in this interval not displayed.   ------------------------------------------------------------------------------------------------------------------  Cardiac Enzymes No results for input(s): TROPONINI in the last 168 hours. ------------------------------------------------------------------------------------------------------------------  RADIOLOGY:  No results found.  EKG:   Orders placed or performed during the hospital encounter of 10/20/18  . ED EKG  . ED EKG  . EKG 12-Lead  . EKG 12-Lead    ASSESSMENT AND PLAN:   58 year old male with past medical history significant for alcoholic liver cirrhosis and hepatic lesions presented to the hospital from GI office secondary to critically low sodium.  1.  Hyponatremia-initially treated as  hypovolemic hyponatremia, however patient failed  to respond to IV normal saline. -Possible SIADH.   on 3% saline-has been on 3% saline for almost 5 days now.  Discontinue today and monitor.  Last sodium at 127. -Appreciate nephrology consult.  Patient remains asymptomatic -Continue fluid restriction  2.  Hypoalbuminemia-secondary to liver cirrhosis.  Receiving IV  albumin infusion   3.  Alcoholic liver cirrhosis-with significant ascites.  Patient had total of 13 L taken out this admission.  Had 8.2 L taken on 11/06/2018, and 5.4 L taken out  on 11/09/2018. -Diuretics are on hold given hyponatremia -Might need another paracentesis tomorrow.  4.  DVT prophylaxis-Lovenox  Patient is up and ambulatory.  Palliative care has been consulted to establish goals of care Patient is a full code Dissipate discharge tomorrow if sodium is stable off the 3% saline.   All the records are reviewed and case discussed with Care Management/Social Workerr. Management plans discussed with the patient, family and they are in agreement.  CODE STATUS: Full code  TOTAL TIME TAKING CARE OF THIS PATIENT: 28 minutes.   POSSIBLE D/C IN 1-2 DAYS, DEPENDING ON CLINICAL CONDITION.   Gladstone Lighter M.D on 11/12/2018 at 10:03 AM  Between 7am to 6pm - Pager - [phone]  After 6pm go to [email or web address] - password [email or web address] [place] [facility]  Office  [phone]  CC: Primary care physician; Patient, No Pcp Per

## 2018-11-12 NOTE — Progress Notes (Signed)
Nutrition Follow-up  DOCUMENTATION CODES:   Not applicable  INTERVENTION:  Discontinue Ensure Enlive per patient request.  Provide Magic cup TID with meals, each supplement provides 290 kcal and 9 grams of protein.  Encouraged intake of adequate calories and protein from meals and snacks. Encouraged small, frequent meals in setting of early satiety. Patient is familiar with low-sodium diet in setting of cirrhosis. Encouraged him to limit intake of processed foods and added sodium, but to still focus on getting adequate calories and protein. Reviewed 1 L fluid restriction patient is on.  NUTRITION DIAGNOSIS:   Increased nutrient needs related to catabolic illness(hepatocellular carcinoma, cirrhosis) as evidenced by estimated needs.  Ongoing.  GOAL:   Patient will meet greater than or equal to 90% of their needs  Progressing.  MONITOR:   PO intake, Supplement acceptance, Labs, Weight trends, I & O's  REASON FOR ASSESSMENT:   Malnutrition Screening Tool    ASSESSMENT:   58 year old male with PMHx of recently diagnosed hepatocellular carcinoma complicated by recurrent ascites, alcoholic cirrhosis of the liver admitted with severe hyponatremia.  Spoke with patient over the phone. He reports his appetite is still decreased. He is doing the best he can do but he is only eating 1-2 small meals per day. He reports he is ordering small amounts at meals (RD working remotely so cannot see what patient is ordering). For breakfast he may have cereal and for dinner he may have a sandwich. He tried the Ensure but it made him nauseas so he is not drinking them anymore. He is amenable to trying [employer]. Discussed diet and fluid restriction with patient per consult today.  Medications reviewed and include: folic acid 1 mg daily, MVI daily, thiamine 100 mg daily, human albumin 25 grams Q6hrs IV.  Labs reviewed: Sodium 127.  Diet Order:   Diet Order            Diet regular Room service  appropriate? Yes; Fluid consistency: Thin; Fluid restriction: Other (see comments)  Diet effective now             EDUCATION NEEDS:   No education needs have been identified at this time  Skin:  Skin Assessment: Reviewed RN Assessment  Last BM:  11/10/2018 per chart  Height:   Ht Readings from Last 1 Encounters:  11/07/18 5\' 7"  (1.702 m)   Weight:   Wt Readings from Last 1 Encounters:  11/07/18 65.4 kg   Ideal Body Weight:  67.3 kg  BMI:  Body mass index is 22.58 kg/m.  Estimated Nutritional Needs:   Kcal:  1875-2160 (MSJ x 1.3-1.5)  Protein:  95-105 grams  Fluid:  per MD  Willey Blade, MS, RD, LDN Office: [phone] Pager: [phone] After Hours/Weekend Pager: [phone]

## 2018-11-12 NOTE — Telephone Encounter (Signed)
Pt wife left vm for Ginger to get a call back please

## 2018-11-12 NOTE — Progress Notes (Signed)
[facility]  ROUNDING NOTE   Subjective:   Na up to 127 Hypertonic infusion ongoing Sitting up in bed, eating lunch  Objective:  Vital signs in last 24 hours:  Temp:  [98.3 F (36.8 C)-98.5 F (36.9 C)] 98.5 F (36.9 C) (08/24 0517) Pulse Rate:  [93-96] 93 (08/24 0517) Resp:  [19] 19 (08/23 1443) BP: (96-111)/(66-78) 111/78 (08/24 0517) SpO2:  [100 %] 100 % (08/24 0517)  Weight change:  Filed Weights   11/07/18 0408 11/07/18 0833  Weight: 63.3 kg 65.4 kg    Intake/Output: I/O last 3 completed shifts: In: 481.3 [I.V.:126.2; IV Piggyback:355.1] Out: -    Intake/Output this shift:  No intake/output data recorded.  Physical Exam: General: NAD,   Head: Normocephalic, atraumatic. Moist oral mucosal membranes  Eyes: Anicteric,    Neck: Supple, trachea midline  Lungs:  Clear to auscultation  Heart:  no rub  Abdomen:  Soft, nontender, + tense ascites  Extremities: no peripheral edema.  Neurologic: Able to answer questions, speech sounds a bit slurred  Skin: No lesions        Basic Metabolic Panel: Recent Labs  Lab 11/07/18 0413 11/07/18 0508  11/09/18 0845  11/10/18 0610  11/11/18 [id number]  11/11/18 1735 11/11/18 2141 11/12/18 0159 11/12/18 0403 11/12/18 0758  NA 116* 117*   < > 127*   < > 123*   < > 124*   < > 127* 125* 126* 126* 127*  K 4.8 4.5  --  4.6  --  5.1  --  4.3  --   --   --   --   --   --   CL 87* 88*  --  105  --  97*  --  98  --   --   --   --   --   --   CO2 22 21*  --  20*  --  21*  --  18*  --   --   --   --   --   --   GLUCOSE 94 91  --  87  --  90  --  95  --   --   --   --   --   --   BUN 27* 27*  --  23*  --  21*  --  19  --   --   --   --   --   --   CREATININE 0.71 0.70  --  0.50*  --  0.60*  --  0.64  --   --   --   --   --   --   CALCIUM 7.7* 7.4*  --  6.9*  --  7.6*  --  8.2*  --   --   --   --   --   --    < > = values in this interval not displayed.    Liver Function Tests: Recent Labs  Lab 11/07/18 0818  11/09/18 0845 11/11/18 0918  AST 124* 106* 70*  ALT 88* 78* 48*  ALKPHOS 142* 126 96  BILITOT 2.5* 1.9* 3.4*  PROT 5.7* 5.5* 5.7*  ALBUMIN 1.9* 1.7* 3.7   No results for input(s): LIPASE, AMYLASE in the last 168 hours. No results for input(s): AMMONIA in the last 168 hours.  CBC: Recent Labs  Lab 11/07/18 0413  WBC 15.1*  HGB 15.2  HCT 41.5  MCV 90.8  PLT 169    Cardiac Enzymes: No results for input(s):  CKTOTAL, CKMB, CKMBINDEX, TROPONINI in the last 168 hours.  BNP: Invalid input(s): POCBNP  CBG: No results for input(s): GLUCAP in the last 168 hours.  Microbiology: Results for orders placed or performed during the hospital encounter of 11/07/18  Novel Coronavirus, NAA (send-out to ref lab)     Status: None   Collection Time: 11/07/18  5:08 AM   Specimen: Nasopharyngeal Swab; Respiratory  Result Value Ref Range Status   SARS-CoV-2, NAA NOT DETECTED NOT DETECTED Final    Comment: (NOTE) This test was developed and its performance characteristics determined by [employer]. This test has not been FDA cleared or approved. This test has been authorized by FDA under an Emergency Use Authorization (EUA). This test is only authorized for the duration of time the declaration that circumstances exist justifying the authorization of the emergency use of in vitro diagnostic tests for detection of SARS-CoV-2 virus and/or diagnosis of COVID-19 infection under section 564(b)(1) of the Act, 21 U.S.C. KA:123727), unless the authorization is terminated or revoked sooner. When diagnostic testing is negative, the possibility of a false negative result should be considered in the context of a patient's recent exposures and the presence of clinical signs and symptoms consistent with COVID-19. An individual without symptoms of COVID-19 and who is not shedding SARS-CoV-2 virus would expect to have a negative (not detected) result in this assay. Performed  At: [facility], [place] [id number] Rush Farmer MD [id number]    [place]  Final    Comment: Performed at [facility], [address] [place], [place] [address]    Coagulation Studies: No results for input(s): LABPROT, INR in the last 72 hours.  Urinalysis: No results for input(s): COLORURINE, LABSPEC, PHURINE, GLUCOSEU, HGBUR, BILIRUBINUR, KETONESUR, PROTEINUR, UROBILINOGEN, NITRITE, LEUKOCYTESUR in the last 72 hours.  Invalid input(s): APPERANCEUR    Imaging: No results found.   Medications:   . albumin human 25 g (11/12/18 0602)  . sodium chloride (hypertonic) 20 mL/hr (11/12/18 0158)   . enoxaparin (LOVENOX) injection  40 mg Subcutaneous Q24H  . feeding supplement (ENSURE ENLIVE)  237 mL Oral Q24H  . folic acid  1 mg Oral Daily  . multivitamin with minerals  1 tablet Oral Daily  . sodium chloride flush  10-40 mL Intracatheter Q12H  . thiamine  100 mg Oral Daily   acetaminophen **OR** acetaminophen, diphenhydrAMINE, LORazepam, magnesium hydroxide, ondansetron **OR** ondansetron (ZOFRAN) IV, sodium chloride flush, traMADol  Assessment/ Plan:  David Brown is a 58 y.o. white male with hepatic cirrhosis, ascites, alcoholism, hepatocellular carcinoma who was admitted to [facility] on 11/07/2018 for Hyponatremia  1. Hyponatremia: 117 on admission.  Baseline sodium of 130 on 10/20/2018. Hyponatremia likely secondary to cirrhosis Follow fluid restriction Continue iv albumin Discontinue 3% saline  2.Cirrhosis with ascites Recent large volume paracentesis - 8.2 liters removed on 8/18 and 5.4 liters on 8/21.   Consider repeat paracentesis     LOS: 5 David Brown 8/24/20208:30 AM

## 2018-11-12 NOTE — Plan of Care (Signed)

## 2018-11-12 NOTE — Progress Notes (Signed)
PHARMACY CONSULT NOTE - FOLLOW UP  Pharmacy Consult for Electrolyte Monitoring and Replacement   Recent Labs: Potassium (mmol/L)  Date Value  11/11/2018 4.3   Magnesium (mg/dL)  Date Value  10/22/2018 1.9   Calcium (mg/dL)  Date Value  11/11/2018 8.2 (L)   Albumin (g/dL)  Date Value  11/11/2018 3.7   Sodium (mmol/L)  Date Value  11/12/2018 126 (L)  11/06/2018 117 (LL)   Assessment: 58 year old male with h/o alcohol abuse. Presented to the ED for low sodium drawn as outpatient. Na 117 on admission and has remained around this level despite receiving NS. Pharmacy consulted to follow patient as he will now be receiving hypertonic saline.  Goal of Therapy:  Rise in Na no greater than 4 mEq over 2 hours or 6 mEq over 4 hours  Plan:  8/23 1735 Na = 127 (increase of 3 mmol/ 3 mEq in ~ 3.5 hours). 8/23 2141 Na= 125 (decreae of 2 mmol/ 2 mEq in ~ 4 hours). Messaged Dr. Juleen China about whether to continue the current rate- per MD, patient will continue the current rate through the night and re-evaluate the rate tomorrow.  8/24 0159 Na = 126, same plan as above.  Continue to check Na q4hrs for now.    Ena Dawley, [facility] 11/12/2018 2:46 AM

## 2018-11-12 NOTE — Progress Notes (Signed)
PHARMACY CONSULT NOTE - FOLLOW UP  Pharmacy Consult for Electrolyte Monitoring and Replacement   Recent Labs: Potassium (mmol/L)  Date Value  11/11/2018 4.3   Magnesium (mg/dL)  Date Value  10/22/2018 1.9   Calcium (mg/dL)  Date Value  11/11/2018 8.2 (L)   Albumin (g/dL)  Date Value  11/11/2018 3.7   Sodium (mmol/L)  Date Value  11/12/2018 126 (L)  11/06/2018 117 (LL)   Assessment: 58 year old male with h/o alcohol abuse. Presented to the ED for low sodium drawn as outpatient. Na 117 on admission and has remained around this level despite receiving NS.   Pharmacy consulted to follow patient as he will now be receiving hypertonic saline.  Goal of Therapy:  Rise in Na no greater than 4 mEq over 2 hours or 6 mEq over 4 hours  Plan:  8/23 1735 Na = 127 (increase of 3 mmol/ 3 mEq in ~ 3.5 hours). 8/23 2141 Na= 125 (decrease of 2 mmol/ 2 mEq in ~ 4 hours).   Messaged Dr. Juleen China about whether to continue the current rate- per MD, patient will continue the current rate through the night and re-evaluate the rate tomorrow.   8/24 0159 Na = 126 8/24 0403 Na = 126  Continue to check Na q4hrs for now.    Lu Duffel, PharmD, BCPS Clinical Pharmacist 11/12/2018 7:11 AM

## 2018-11-13 ENCOUNTER — Inpatient Hospital Stay: Payer: Medicaid Other

## 2018-11-13 ENCOUNTER — Ambulatory Visit: Payer: Self-pay

## 2018-11-13 LAB — BASIC METABOLIC PANEL
Anion gap: 5 (ref 5–15)
BUN: 16 mg/dL (ref 6–20)
CO2: 19 mmol/L — ABNORMAL LOW (ref 22–32)
Calcium: 8.6 mg/dL — ABNORMAL LOW (ref 8.9–10.3)
Chloride: 104 mmol/L (ref 98–111)
Creatinine, Ser: 0.46 mg/dL — ABNORMAL LOW (ref 0.61–1.24)
GFR calc Af Amer: >60 mL/min (ref >60)
GFR calc non Af Amer: >60 mL/min (ref >60)
Glucose, Bld: 114 mg/dL — ABNORMAL HIGH (ref 70–99)
Potassium: 4.8 mmol/L (ref 3.5–5.1)
Sodium: 128 mmol/L — ABNORMAL LOW (ref 135–145)

## 2018-11-13 MED ORDER — SPIRONOLACTONE 50 MG PO TABS: 50.0000 mg | ORAL_TABLET | Freq: Every day | ORAL | 2 refills | Status: DC

## 2018-11-13 NOTE — Discharge Summary (Signed)
[place] [facility] [place] NAME: David Brown    MR#:  [id number]  DATE OF BIRTH:  October 22, 1960  DATE OF ADMISSION:  11/07/2018   ADMITTING PHYSICIAN: Christel Mormon, MD  DATE OF DISCHARGE: 11/13/2018 11:41 AM  PRIMARY CARE PHYSICIAN: Patient, No Pcp Per   ADMISSION DIAGNOSIS:   Hyponatremia [E87.1]  DISCHARGE DIAGNOSIS:   Active Problems:   Hyponatremia   Palliative care encounter   SECONDARY DIAGNOSIS:   Past Medical History:  Diagnosis Date   Cancer [facility])     HOSPITAL COURSE:   58 year old male with past medical history significant for alcoholic liver cirrhosis and hepatic lesions presented to the hospital from GI office secondary to critically low sodium.  1.  Hyponatremia-initially treated as  hypovolemic hyponatremia, however patient failed to respond to IV normal saline. -Possible SIADH.     Received 3% saline for almost 5 days. -Also received IV albumin infusion.  Discharge sodium is 128 and patient is completely asymptomatic.  Fluid restriction explained as patient tends to drink a lot of water.  -Appreciate nephrology consult.  Patient remains asymptomatic -Continue fluid restriction  2.  Hypoalbuminemia-secondary to liver cirrhosis.    Received IV albumin infusion, last albumin prior to discharge was 3.7.  3.  Alcoholic liver cirrhosis-with significant ascites.  Patient had total of 21 L taken out this admission.  Had 8.2 L taken on 11/06/2018, and 5.4 L taken out  on 11/09/2018 and 8 L taken out on 11/13/2018 prior to discharge.. -Restarted Aldactone at discharge. -Patient wants to follow-up at [facility].   Patient is up and ambulatory.  Palliative care has been consulted to establish goals of care Patient is a full code Discharge today   DISCHARGE CONDITIONS:   Guarded  CONSULTS OBTAINED:   Treatment Team:  Lavonia Dana, MD  DRUG ALLERGIES:   No Known Allergies DISCHARGE MEDICATIONS:   Allergies as of  11/13/2018   No Known Allergies     Medication List    STOP taking these medications   furosemide 40 MG tablet Commonly known as: LASIX     TAKE these medications   multivitamin with minerals Tabs tablet Take 1 tablet by mouth daily.   spironolactone 50 MG tablet Commonly known as: Aldactone Take 1 tablet (50 mg total) by mouth daily. What changed: when to take this   traMADol 50 MG tablet Commonly known as: ULTRAM Take 1 tablet (50 mg total) by mouth every 6 (six) hours as needed.        DISCHARGE INSTRUCTIONS:   1.  PCP follow-up in 1 to 2 weeks 2.  Follow-up with hepatology at [facility] in 1 to 2 weeks  DIET:   Cardiac diet  ACTIVITY:   Activity as tolerated  OXYGEN:   Home Oxygen: No.  Oxygen Delivery: room air  DISCHARGE LOCATION:   home   If you experience worsening of your admission symptoms, develop shortness of breath, life threatening emergency, suicidal or homicidal thoughts you must seek medical attention immediately by calling 911 or calling your MD immediately  if symptoms less severe.  You Must read complete instructions/literature along with all the possible adverse reactions/side effects for all the Medicines you take and that have been prescribed to you. Take any new Medicines after you have completely understood and accpet all the possible adverse reactions/side effects.   Please note  You were cared for by a hospitalist during your hospital stay. If you have any questions about  your discharge medications or the care you received while you were in the hospital after you are discharged, you can call the unit and asked to speak with the hospitalist on call if the hospitalist that took care of you is not available. Once you are discharged, your primary care physician will handle any further medical issues. Please note that NO REFILLS for any discharge medications will be authorized once you are discharged, as it is imperative that you return to your  primary care physician (or establish a relationship with a primary care physician if you do not have one) for your aftercare needs so that they can reassess your need for medications and monitor your lab values.    On the day of Discharge:  VITAL SIGNS:   Blood pressure 98/72, pulse 98, temperature 98.5 F (36.9 C), temperature source Oral, resp. rate 18, height 5\' 7"  (1.702 m), weight 65.4 kg, SpO2 95 %.  PHYSICAL EXAMINATION:    GENERAL:  58 y.o.-year-old thin built patient lying in the bed with no acute distress.  EYES: Pupils equal, round, reactive to light and accommodation. No scleral icterus. Extraocular muscles intact.  HEENT: Head atraumatic, normocephalic. Oropharynx and nasopharynx clear.  NECK:  Supple, no jugular venous distention. No thyroid enlargement, no tenderness.  LUNGS: Normal breath sounds bilaterally, no wheezing, rales,rhonchi or crepitation. No use of accessory muscles of respiration.  CARDIOVASCULAR: S1, S2 normal. No murmurs, rubs, or gallops.  ABDOMEN: Soft, nontender, less distended today as patient just had paracentesis. Bowel sounds present. No organomegaly or mass.  EXTREMITIES: No pedal edema, cyanosis, or clubbing.  NEUROLOGIC: Cranial nerves II through XII are intact. Muscle strength 5/5 in all extremities. Sensation intact. Gait not checked.  PSYCHIATRIC: The patient is alert and oriented x 3.  SKIN: No obvious rash, lesion, or ulcer.   DATA REVIEW:   CBC Recent Labs  Lab 11/07/18 0413  WBC 15.1*  HGB 15.2  HCT 41.5  PLT 169    Chemistries  Recent Labs  Lab 11/11/18 0918  11/13/18 0447  NA 124*   < > 128*  K 4.3  --  4.8  CL 98  --  104  CO2 18*  --  19*  GLUCOSE 95  --  114*  BUN 19  --  16  CREATININE 0.64  --  0.46*  CALCIUM 8.2*  --  8.6*  AST 70*  --   --   ALT 48*  --   --   ALKPHOS 96  --   --   BILITOT 3.4*  --   --    < > = values in this interval not displayed.     Microbiology Results  Results for orders placed  or performed during the hospital encounter of 11/07/18  Novel Coronavirus, NAA (send-out to ref lab)     Status: None   Collection Time: 11/07/18  5:08 AM   Specimen: Nasopharyngeal Swab; Respiratory  Result Value Ref Range Status   SARS-CoV-2, NAA NOT DETECTED NOT DETECTED Final    Comment: (NOTE) This test was developed and its performance characteristics determined by [employer]. This test has not been FDA cleared or approved. This test has been authorized by FDA under an Emergency Use Authorization (EUA). This test is only authorized for the duration of time the declaration that circumstances exist justifying the authorization of the emergency use of in vitro diagnostic tests for detection of SARS-CoV-2 virus and/or diagnosis of COVID-19 infection under section 564(b)(1) of the Act, 21  U.S.C. 360bbb-3(b)(1), unless the authorization is terminated or revoked sooner. When diagnostic testing is negative, the possibility of a false negative result should be considered in the context of a patient's recent exposures and the presence of clinical signs and symptoms consistent with COVID-19. An individual without symptoms of COVID-19 and who is not shedding SARS-CoV-2 virus would expect to have a negative (not detected) result in this assay. Performed  At: [facility] [address] [place], [place] [id number] Rush Farmer MD [id number]    [place]  Final    Comment: Performed at [facility], [address] [place], [place] [address]    RADIOLOGY:  US Paracentesis  Result Date: 11/13/2018 INDICATION: Cirrhosis, ascites, abdominal distention and discomfort EXAM: ULTRASOUND GUIDED THERAPEUTIC PARACENTESIS MEDICATIONS: 1% lidocaine local COMPLICATIONS: None immediate. PROCEDURE: Informed written consent was obtained from the patient after a discussion of the risks, benefits and alternatives to treatment. A timeout was performed  prior to the initiation of the procedure. Initial ultrasound scanning demonstrates a large amount of ascites within the right lower abdominal quadrant. The right lower abdomen was prepped and draped in the usual sterile fashion. 1% lidocaine with epinephrine was used for local anesthesia. Following this, a 6 Fr Safe-T-Centesis catheter was introduced. An ultrasound image was saved for documentation purposes. The paracentesis was performed. The catheter was removed and a dressing was applied. The patient tolerated the procedure well without immediate post procedural complication. FINDINGS: A total of approximately 8.8 L of clear peritoneal fluid was removed. Sample was not sent for laboratory analysis IMPRESSION: Successful ultrasound-guided paracentesis yielding 8.8 liters of peritoneal fluid. Electronically Signed   By: Jerilynn Mages.  Shick M.D.   On: 11/13/2018 09:38     Management plans discussed with the patient, family and they are in agreement.  CODE STATUS:     Code Status Orders  (From admission, onward)         Start     Ordered   11/07/18 0556  Full code  Continuous     11/07/18 0601        Code Status History    Date Active Date Inactive Code Status Order ID Comments User Context   10/20/2018 2122 10/22/2018 1938 Full Code [id number]  [name], MD Inpatient   09/26/2018 0056 09/26/2018 2040 Full Code [id number]  Mayer Camel, NP ED   Advance Care Planning Activity      TOTAL TIME TAKING CARE OF THIS PATIENT: 38 minutes.    Gladstone Lighter M.D on 11/13/2018 at 2:30 PM  Between 7am to 6pm - Pager - [phone]  After 6pm go to [email or web address] - Proofreader  [facility]  Office  [phone]  CC: Primary care physician; Patient, No Pcp Per   Note: This dictation was prepared with Dragon dictation along with smaller phrase technology. Any transcriptional errors that result from this process are unintentional.

## 2018-11-13 NOTE — Progress Notes (Signed)
[facility]  ROUNDING NOTE   Subjective:   Na up to 128 8.8 L of fluid removed with paracentesis 08/24 0701 - 08/25 0700 In: 840 [P.O.:840] Out: -  Feels better, abdomen softer No acute c/o  Objective:  Vital signs in last 24 hours:  Temp:  [98.5 F (36.9 C)-98.8 F (37.1 C)] 98.5 F (36.9 C) (08/25 0455) Pulse Rate:  [97-102] 98 (08/25 0914) Resp:  [18] 18 (08/25 0455) BP: (97-110)/(70-86) 98/72 (08/25 0914) SpO2:  [95 %-100 %] 95 % (08/25 0914)  Weight change:  Filed Weights   11/07/18 0408 11/07/18 0833  Weight: 63.3 kg 65.4 kg    Intake/Output: I/O last 3 completed shifts: In: 24 [P.O.:840; I.V.:10] Out: -    Intake/Output this shift:  No intake/output data recorded.  Physical Exam: General: NAD,   Head: Normocephalic, atraumatic. Moist oral mucosal membranes  Neck: Supple, trachea midline  Lungs:  Clear to auscultation  Heart:  no rub  Abdomen:  Soft, nontender, much softer today  Extremities: no peripheral edema.  Neurologic: Able to answer questions, speech sounds a bit slurred  Skin: No lesions        Basic Metabolic Panel: Recent Labs  Lab 11/07/18 0508  11/09/18 0845  11/10/18 0610  11/11/18 0918  11/11/18 2141 11/12/18 0159 11/12/18 0403 11/12/18 0758 11/13/18 0447  NA 117*   < > 127*   < > 123*   < > 124*   < > 125* 126* 126* 127* 128*  K 4.5  --  4.6  --  5.1  --  4.3  --   --   --   --   --  4.8  CL 88*  --  105  --  97*  --  98  --   --   --   --   --  104  CO2 21*  --  20*  --  21*  --  18*  --   --   --   --   --  19*  GLUCOSE 91  --  87  --  90  --  95  --   --   --   --   --  114*  BUN 27*  --  23*  --  21*  --  19  --   --   --   --   --  16  CREATININE 0.70  --  0.50*  --  0.60*  --  0.64  --   --   --   --   --  0.46*  CALCIUM 7.4*  --  6.9*  --  7.6*  --  8.2*  --   --   --   --   --  8.6*   < > = values in this interval not displayed.    Liver Function Tests: Recent Labs  Lab 11/07/18 0818  11/09/18 0845 11/11/18 0918  AST 124* 106* 70*  ALT 88* 78* 48*  ALKPHOS 142* 126 96  BILITOT 2.5* 1.9* 3.4*  PROT 5.7* 5.5* 5.7*  ALBUMIN 1.9* 1.7* 3.7   No results for input(s): LIPASE, AMYLASE in the last 168 hours. No results for input(s): AMMONIA in the last 168 hours.  CBC: Recent Labs  Lab 11/07/18 0413  WBC 15.1*  HGB 15.2  HCT 41.5  MCV 90.8  PLT 169    Cardiac Enzymes: No results for input(s): CKTOTAL, CKMB, CKMBINDEX, TROPONINI in the last 168 hours.  BNP: Invalid input(s):  POCBNP  CBG: No results for input(s): GLUCAP in the last 168 hours.  Microbiology: Results for orders placed or performed during the hospital encounter of 11/07/18  Novel Coronavirus, NAA (send-out to ref lab)     Status: None   Collection Time: 11/07/18  5:08 AM   Specimen: Nasopharyngeal Swab; Respiratory  Result Value Ref Range Status   SARS-CoV-2, NAA NOT DETECTED NOT DETECTED Final    Comment: (NOTE) This test was developed and its performance characteristics determined by [employer]. This test has not been FDA cleared or approved. This test has been authorized by FDA under an Emergency Use Authorization (EUA). This test is only authorized for the duration of time the declaration that circumstances exist justifying the authorization of the emergency use of in vitro diagnostic tests for detection of SARS-CoV-2 virus and/or diagnosis of COVID-19 infection under section 564(b)(1) of the Act, 21 U.S.C. [id number]), unless the authorization is terminated or revoked sooner. When diagnostic testing is negative, the possibility of a false negative result should be considered in the context of a patient's recent exposures and the presence of clinical signs and symptoms consistent with COVID-19. An individual without symptoms of COVID-19 and who is not shedding SARS-CoV-2 virus would expect to have a negative (not detected) result in this assay. Performed  At: [facility] [place], [place] [id number] Rush Farmer MD [id number]    [place]  Final    Comment: Performed at [facility], [address] [place], [place] [address]    Coagulation Studies: No results for input(s): LABPROT, INR in the last 72 hours.  Urinalysis: No results for input(s): COLORURINE, LABSPEC, PHURINE, GLUCOSEU, HGBUR, BILIRUBINUR, KETONESUR, PROTEINUR, UROBILINOGEN, NITRITE, LEUKOCYTESUR in the last 72 hours.  Invalid input(s): APPERANCEUR    Imaging: US Paracentesis  Result Date: 11/13/2018 INDICATION: Cirrhosis, ascites, abdominal distention and discomfort EXAM: ULTRASOUND GUIDED THERAPEUTIC PARACENTESIS MEDICATIONS: 1% lidocaine local COMPLICATIONS: None immediate. PROCEDURE: Informed written consent was obtained from the patient after a discussion of the risks, benefits and alternatives to treatment. A timeout was performed prior to the initiation of the procedure. Initial ultrasound scanning demonstrates a large amount of ascites within the right lower abdominal quadrant. The right lower abdomen was prepped and draped in the usual sterile fashion. 1% lidocaine with epinephrine was used for local anesthesia. Following this, a 6 Fr Safe-T-Centesis catheter was introduced. An ultrasound image was saved for documentation purposes. The paracentesis was performed. The catheter was removed and a dressing was applied. The patient tolerated the procedure well without immediate post procedural complication. FINDINGS: A total of approximately 8.8 L of clear peritoneal fluid was removed. Sample was not sent for laboratory analysis IMPRESSION: Successful ultrasound-guided paracentesis yielding 8.8 liters of peritoneal fluid. Electronically Signed   By: Jerilynn Mages.  Shick M.D.   On: 11/13/2018 09:38     Medications:   . albumin human 25 g (11/13/18 0454)   . enoxaparin (LOVENOX) injection  40 mg Subcutaneous Q24H  . folic acid  1 mg  Oral Daily  . multivitamin with minerals  1 tablet Oral Daily  . sodium chloride flush  10-40 mL Intracatheter Q12H  . thiamine  100 mg Oral Daily   acetaminophen **OR** acetaminophen, diphenhydrAMINE, LORazepam, magnesium hydroxide, ondansetron **OR** ondansetron (ZOFRAN) IV, sodium chloride flush, traMADol  Assessment/ Plan:  Mr. CORDARIO BRAUDE is a 58 y.o. white male with hepatic cirrhosis, ascites, alcoholism, hepatocellular carcinoma who was admitted to [facility] on 11/07/2018 for Hyponatremia  1.  Hyponatremia: 117 on admission.  Baseline sodium of 130 on 10/20/2018. Hyponatremia likely secondary to cirrhosis Follow fluid restriction   2.Cirrhosis with ascites Recent large volume paracentesis - 8.2 liters removed on 8/18 and 5.4 liters on 8/21.   8.8 L removed on 8/25 F/u with GI   LOS: 6 Zidan Helget 8/25/202010:05 AM

## 2018-11-13 NOTE — Progress Notes (Signed)
Pt d/c to home via sister [place] [facility]. PICC removed intact. VSS. Education completed. All belongings sent with pt.

## 2018-11-17 ENCOUNTER — Other Ambulatory Visit (HOSPITAL_COMMUNITY)
Admission: RE | Admit: 2018-11-17 | Discharge: 2018-11-17 | Disposition: A | Discharge status: Home / Self Care | Payer: Medicaid Other | Source: Ambulatory Visit | Attending: Interventional Radiology | Admitting: Interventional Radiology

## 2018-11-17 DIAGNOSIS — Z20828 Contact with and (suspected) exposure to other viral communicable diseases: Secondary | ICD-10-CM | POA: Insufficient documentation

## 2018-11-17 DIAGNOSIS — Z01812 Encounter for preprocedural laboratory examination: Secondary | ICD-10-CM | POA: Diagnosis not present

## 2018-11-17 LAB — SARS CORONAVIRUS 2 (TAT 6-24 HRS): SARS Coronavirus 2: NEGATIVE

## 2018-11-19 ENCOUNTER — Other Ambulatory Visit: Payer: Self-pay | Admitting: Radiology

## 2018-11-20 ENCOUNTER — Other Ambulatory Visit: Payer: Self-pay | Admitting: Radiology

## 2018-11-20 ENCOUNTER — Encounter (HOSPITAL_COMMUNITY): Payer: Self-pay

## 2018-11-20 ENCOUNTER — Ambulatory Visit (HOSPITAL_COMMUNITY)
Admission: RE | Admit: 2018-11-20 | Discharge: 2018-11-20 | Disposition: A | Discharge status: Home / Self Care | Payer: Medicaid Other | Source: Ambulatory Visit | Attending: Radiology | Admitting: Radiology

## 2018-11-20 ENCOUNTER — Other Ambulatory Visit: Payer: Self-pay

## 2018-11-20 ENCOUNTER — Encounter (HOSPITAL_COMMUNITY)
Admission: RE | Admit: 2018-11-20 | Discharge: 2018-11-20 | Disposition: A | Discharge status: Home / Self Care | Payer: Medicaid Other | Source: Ambulatory Visit | Attending: Interventional Radiology | Admitting: Interventional Radiology

## 2018-11-20 DIAGNOSIS — Z01811 Encounter for preprocedural respiratory examination: Secondary | ICD-10-CM

## 2018-11-20 HISTORY — DX: Dyspnea, unspecified: R06.00

## 2018-11-20 HISTORY — DX: Unspecified cirrhosis of liver: K74.60

## 2018-11-20 LAB — COMPREHENSIVE METABOLIC PANEL
ALT: 46 U/L — ABNORMAL HIGH (ref 0–44)
AST: 72 U/L — ABNORMAL HIGH (ref 15–41)
Albumin: 3.6 g/dL (ref 3.5–5.0)
Alkaline Phosphatase: 101 U/L (ref 38–126)
Anion gap: 8 (ref 5–15)
BUN: 19 mg/dL (ref 6–20)
CO2: 24 mmol/L (ref 22–32)
Calcium: 9.2 mg/dL (ref 8.9–10.3)
Chloride: 100 mmol/L (ref 98–111)
Creatinine, Ser: 0.73 mg/dL (ref 0.61–1.24)
GFR calc Af Amer: >60 mL/min (ref >60)
GFR calc non Af Amer: >60 mL/min (ref >60)
Glucose, Bld: 91 mg/dL (ref 70–99)
Potassium: 5.2 mmol/L — ABNORMAL HIGH (ref 3.5–5.1)
Sodium: 132 mmol/L — ABNORMAL LOW (ref 135–145)
Total Bilirubin: 3.2 mg/dL — ABNORMAL HIGH (ref 0.3–1.2)
Total Protein: 6.5 g/dL (ref 6.5–8.1)

## 2018-11-20 LAB — CBC WITH DIFFERENTIAL/PLATELET
Abs Immature Granulocytes: 0.04 10*3/uL (ref 0.00–0.07)
Basophils Absolute: 0 10*3/uL (ref 0.0–0.1)
Basophils Relative: 1 %
Eosinophils Absolute: 0.2 10*3/uL (ref 0.0–0.5)
Eosinophils Relative: 3 %
HCT: 40 % (ref 39.0–52.0)
Hemoglobin: 13.6 g/dL (ref 13.0–17.0)
Immature Granulocytes: 1 %
Lymphocytes Relative: 30 %
Lymphs Abs: 2.4 10*3/uL (ref 0.7–4.0)
MCH: 33.6 pg (ref 26.0–34.0)
MCHC: 34 g/dL (ref 30.0–36.0)
MCV: 98.8 fL (ref 80.0–100.0)
Monocytes Absolute: 0.9 10*3/uL (ref 0.1–1.0)
Monocytes Relative: 11 %
Neutro Abs: 4.5 10*3/uL (ref 1.7–7.7)
Neutrophils Relative %: 54 %
Platelets: 144 10*3/uL — ABNORMAL LOW (ref 150–400)
RBC: 4.05 MIL/uL — ABNORMAL LOW (ref 4.22–5.81)
RDW: 15.3 % (ref 11.5–15.5)
WBC: 8.1 10*3/uL (ref 4.0–10.5)
nRBC: 0 % (ref 0.0–0.2)

## 2018-11-20 LAB — ABO/RH: ABO/RH(D): O POS

## 2018-11-20 LAB — PROTIME-INR
INR: 1.4 — ABNORMAL HIGH (ref 0.8–1.2)
Prothrombin Time: 17.2 seconds — ABNORMAL HIGH (ref 11.4–15.2)

## 2018-11-20 NOTE — Anesthesia Preprocedure Evaluation (Addendum)
Anesthesia Evaluation  Patient identified by MRN, date of birth, ID band Patient awake    Reviewed: Allergy & Precautions, NPO status , Patient's Chart, lab work & pertinent test results  History of Anesthesia Complications Negative for: history of anesthetic complications  Airway Mallampati: I  TM Distance: >3 FB Neck ROM: Full    Dental  (+) Poor Dentition, Edentulous Upper, Missing,    Pulmonary Current Smoker,    Pulmonary exam normal        Cardiovascular negative cardio ROS Normal cardiovascular exam     Neuro/Psych TIA (2018)   GI/Hepatic negative GI ROS, (+) Cirrhosis   ascites    , Liver mass Plts 144, INR 1.4   Endo/Other  negative endocrine ROS  Renal/GU negative Renal ROS     Musculoskeletal negative musculoskeletal ROS (+)   Abdominal   Peds  Hematology negative hematology ROS (+)   Anesthesia Other Findings Day of surgery medications reviewed with the patient.  Reproductive/Obstetrics                           Anesthesia Physical Anesthesia Plan  ASA: II  Anesthesia Plan: General   Post-op Pain Management:    Induction: Intravenous  PONV Risk Score and Plan: 3 and Treatment may vary due to age or medical condition, Ondansetron, Dexamethasone and Midazolam  Airway Management Planned: Oral ETT  Additional Equipment: None  Intra-op Plan:   Post-operative Plan: Extubation in OR  Informed Consent: I have reviewed the patients History and Physical, chart, labs and discussed the procedure including the risks, benefits and alternatives for the proposed anesthesia with the patient or authorized representative who has indicated his/her understanding and acceptance.     Dental advisory given  Plan Discussed with: CRNA  Anesthesia Plan Comments: (See PAT note 11/20/2018, Konrad Felix, PA-C)      Anesthesia Quick Evaluation

## 2018-11-20 NOTE — Progress Notes (Signed)
11/13/2018- noted in Epic- US Paracentesis and Lab-BMP (Sodium- 128)  10/20/2018- noted in Epic-EKG (unconfirmed)

## 2018-11-20 NOTE — Patient Instructions (Addendum)
DUE TO COVID-19 ONLY ONE VISITOR IS ALLOWED TO COME WITH YOU AND STAY IN THE WAITING ROOM ONLY DURING PRE OP AND PROCEDURE DAY OF SURGERY. THE 1 VISITOR MAY VISIT WITH YOU AFTER SURGERY IN YOUR PRIVATE ROOM DURING VISITING HOURS ONLY!   ONCE YOUR COVID TEST IS COMPLETED, PLEASE BEGIN THE QUARANTINE INSTRUCTIONS AS OUTLINED IN YOUR HANDOUT.                David Brown  11/20/2018   Your procedure is scheduled on: Wednesday 11/21/2018   Report to [facility] Main  Entrance              Report to Radiology  at   0700 AM     Call this number if you have problems the morning of surgery [phone]    Remember: Do not eat food or drink liquids :After Midnight.              BRUSH YOUR TEETH MORNING OF SURGERY AND RINSE YOUR MOUTH OUT, NO CHEWING GUM CANDY OR MINTS.     Take these medicines the morning of surgery with A SIP OF WATER: none                                 You may not have any metal on your body including hair pins and              piercings  Do not wear jewelry, make-up, lotions, powders or perfumes, deodorant                        Men may shave face and neck.   Do not bring valuables to the hospital. [place].  Contacts, dentures or bridgework may not be worn into surgery.  Leave suitcase in the car. After surgery it may be brought to your room.                  Please read over the following fact sheets you were given: _____________________________________________________________________             [facility] - Preparing for Surgery Before surgery, you can play an important role.  Because skin is not sterile, your skin needs to be as free of germs as possible.  You can reduce the number of germs on your skin by washing with CHG (chlorahexidine gluconate) soap before surgery.  CHG is an antiseptic cleaner which kills germs and bonds with the skin to continue killing germs even after  washing. Please DO NOT use if you have an allergy to CHG or antibacterial soaps.  If your skin becomes reddened/irritated stop using the CHG and inform your nurse when you arrive at Short Stay. Do not shave (including legs and underarms) for at least 48 hours prior to the first CHG shower.  You may shave your face/neck. Please follow these instructions carefully:  1.  Shower with CHG Soap the night before surgery and the  morning of Surgery.  2.  If you choose to wash your hair, wash your hair first as usual with your  normal  shampoo.  3.  After you shampoo, rinse your hair and body thoroughly to remove the  shampoo.  4.  Use CHG as you would any other liquid soap.  You can apply chg directly  to the skin and wash                       Gently with a scrungie or clean washcloth.  5.  Apply the CHG Soap to your body ONLY FROM THE NECK DOWN.   Do not use on face/ open                           Wound or open sores. Avoid contact with eyes, ears mouth and genitals (private parts).                       Wash face,  Genitals (private parts) with your normal soap.             6.  Wash thoroughly, paying special attention to the area where your surgery  will be performed.  7.  Thoroughly rinse your body with warm water from the neck down.  8.  DO NOT shower/wash with your normal soap after using and rinsing off  the CHG Soap.                9.  Pat yourself dry with a clean towel.            10.  Wear clean pajamas.            11.  Place clean sheets on your bed the night of your first shower and do not  sleep with pets. Day of Surgery : Do not apply any lotions/deodorants the morning of surgery.  Please wear clean clothes to the hospital/surgery center.  FAILURE TO FOLLOW THESE INSTRUCTIONS MAY RESULT IN THE CANCELLATION OF YOUR SURGERY PATIENT SIGNATURE_________________________________  NURSE  SIGNATURE__________________________________  ________________________________________________________________________  WHAT IS A BLOOD TRANSFUSION? Blood Transfusion Information  A transfusion is the replacement of blood or some of its parts. Blood is made up of multiple cells which provide different functions.  Red blood cells carry oxygen and are used for blood loss replacement.  White blood cells fight against infection.  Platelets control bleeding.  Plasma helps clot blood.  Other blood products are available for specialized needs, such as hemophilia or other clotting disorders. BEFORE THE TRANSFUSION  Who gives blood for transfusions?   Healthy volunteers who are fully evaluated to make sure their blood is safe. This is blood bank blood. Transfusion therapy is the safest it has ever been in the practice of medicine. Before blood is taken from a donor, a complete history is taken to make sure that person has no history of diseases nor engages in risky social behavior (examples are intravenous drug use or sexual activity with multiple partners). The donor's travel history is screened to minimize risk of transmitting infections, such as malaria. The donated blood is tested for signs of infectious diseases, such as HIV and hepatitis. The blood is then tested to be sure it is compatible with you in order to minimize the chance of a transfusion reaction. If you or a relative donates blood, this is often done in anticipation of surgery and is not appropriate for emergency situations. It takes many days to process the donated blood. RISKS AND COMPLICATIONS Although transfusion therapy is very safe and saves many lives, the main dangers of transfusion include:   Getting an infectious disease.  Developing a transfusion reaction. This  is an allergic reaction to something in the blood you were given. Every precaution is taken to prevent this. The decision to have a blood transfusion has been  considered carefully by your caregiver before blood is given. Blood is not given unless the benefits outweigh the risks. AFTER THE TRANSFUSION  Right after receiving a blood transfusion, you will usually feel much better and more energetic. This is especially true if your red blood cells have gotten low (anemic). The transfusion raises the level of the red blood cells which carry oxygen, and this usually causes an energy increase.  The nurse administering the transfusion will monitor you carefully for complications. HOME CARE INSTRUCTIONS  No special instructions are needed after a transfusion. You may find your energy is better. Speak with your caregiver about any limitations on activity for underlying diseases you may have. SEEK MEDICAL CARE IF:   Your condition is not improving after your transfusion.  You develop redness or irritation at the intravenous (IV) site. SEEK IMMEDIATE MEDICAL CARE IF:  Any of the following symptoms occur over the next 12 hours:  Shaking chills.  You have a temperature by mouth above 102 F (38.9 C), not controlled by medicine.  Chest, back, or muscle pain.  People around you feel you are not acting correctly or are confused.  Shortness of breath or difficulty breathing.  Dizziness and fainting.  You get a rash or develop hives.  You have a decrease in urine output.  Your urine turns a dark color or changes to pink, red, or brown. Any of the following symptoms occur over the next 10 days:  You have a temperature by mouth above 102 F (38.9 C), not controlled by medicine.  Shortness of breath.  Weakness after normal activity.  The white part of the eye turns yellow (jaundice).  You have a decrease in the amount of urine or are urinating less often.  Your urine turns a dark color or changes to pink, red, or brown. Document Released: 03/04/2000 Document Revised: 05/30/2011 Document Reviewed: 10/22/2007 [facility] Patient Information 2014  [place], [place].  _______________________________________________________________________
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
## 2018-11-20 NOTE — Progress Notes (Signed)
PCP - No Cardiologist -N/A   Chest x-ray -09/25/2018 EPIC  EKG - 10/20/2018 Doctor unconfirmed, repeated 11/20/2018 Stress Test - N/A  ECHO - N/A Cardiac Cath - N/A  Sleep Study - N/A CPAP - N/A  Fasting Blood Sugar - N/A Checks Blood Sugar _____ times a day  Blood Thinner Instructions: Aspirin Instructions:N/A  Patient has Hx of TIA 06/12/2016 Patient states has at times shortness of breath with exertion ( when he walks to mailbox). Patient with no c/o shortness of breath or chest pain today at PAT visit.  Anesthesia review:   Patient denies shortness of breath, fever, cough and chest pain at PAT appointment   Patient verbalized understanding of instructions that were given to them at the PAT appointment. Patient was also instructed that they will need to review over the PAT instructions again at home before surgery.

## 2018-11-21 ENCOUNTER — Ambulatory Visit (HOSPITAL_COMMUNITY): Payer: Medicaid Other | Admitting: Physician Assistant

## 2018-11-21 ENCOUNTER — Ambulatory Visit (HOSPITAL_COMMUNITY)
Admission: RE | Admit: 2018-11-21 | Discharge: 2018-11-21 | Disposition: A | Discharge status: Home / Self Care | Payer: Medicaid Other | Source: Ambulatory Visit | Attending: Interventional Radiology | Admitting: Interventional Radiology

## 2018-11-21 ENCOUNTER — Encounter (HOSPITAL_COMMUNITY): Payer: Self-pay | Admitting: *Deleted

## 2018-11-21 ENCOUNTER — Encounter (HOSPITAL_COMMUNITY): Payer: Self-pay

## 2018-11-21 ENCOUNTER — Encounter (HOSPITAL_COMMUNITY)
Admission: RE | Disposition: A | Discharge status: Home / Self Care | Payer: Self-pay | Source: Home / Self Care | Attending: Interventional Radiology

## 2018-11-21 ENCOUNTER — Observation Stay (HOSPITAL_COMMUNITY)
Admission: RE | Admit: 2018-11-21 | Discharge: 2018-11-22 | Disposition: A | Discharge status: Home / Self Care | Payer: Medicaid Other | Attending: Interventional Radiology | Admitting: Interventional Radiology

## 2018-11-21 ENCOUNTER — Ambulatory Visit (HOSPITAL_COMMUNITY): Payer: Medicaid Other | Admitting: Certified Registered Nurse Anesthetist

## 2018-11-21 DIAGNOSIS — C220 Liver cell carcinoma: Secondary | ICD-10-CM | POA: Diagnosis present

## 2018-11-21 DIAGNOSIS — N35919 Unspecified urethral stricture, male, unspecified site: Secondary | ICD-10-CM | POA: Insufficient documentation

## 2018-11-21 DIAGNOSIS — R160 Hepatomegaly, not elsewhere classified: Secondary | ICD-10-CM

## 2018-11-21 DIAGNOSIS — F17210 Nicotine dependence, cigarettes, uncomplicated: Secondary | ICD-10-CM | POA: Diagnosis not present

## 2018-11-21 DIAGNOSIS — Z8673 Personal history of transient ischemic attack (TIA), and cerebral infarction without residual deficits: Secondary | ICD-10-CM | POA: Insufficient documentation

## 2018-11-21 DIAGNOSIS — K7460 Unspecified cirrhosis of liver: Secondary | ICD-10-CM | POA: Insufficient documentation

## 2018-11-21 HISTORY — PX: RADIOLOGY WITH ANESTHESIA: SHX6223

## 2018-11-21 LAB — TYPE AND SCREEN
ABO/RH(D): O POS
Antibody Screen: NEGATIVE

## 2018-11-21 SURGERY — RADIOLOGY WITH ANESTHESIA
Anesthesia: General

## 2018-11-21 MED ORDER — FENTANYL CITRATE (PF) 100 MCG/2ML IJ SOLN: 25.0000 ug | INTRAMUSCULAR | Status: DC | PRN

## 2018-11-21 MED ORDER — OXYCODONE HCL 5 MG PO TABS: 5.0000 mg | ORAL_TABLET | Freq: Once | ORAL | Status: DC | PRN

## 2018-11-21 MED ORDER — DOCUSATE SODIUM 100 MG PO CAPS
100.0000 mg | ORAL_CAPSULE | Freq: Two times a day (BID) | ORAL | Status: DC
  Administered 2018-11-21: 100 mg via ORAL
  Filled 2018-11-21 (×2): qty 1

## 2018-11-21 MED ORDER — DEXAMETHASONE SODIUM PHOSPHATE 10 MG/ML IJ SOLN
INTRAMUSCULAR | Status: DC | PRN
  Administered 2018-11-21: 10 mg via INTRAVENOUS

## 2018-11-21 MED ORDER — SODIUM CHLORIDE 0.9 % IV SOLN
INTRAVENOUS | Status: DC
  Administered 2018-11-21 – 2018-11-22 (×2): via INTRAVENOUS

## 2018-11-21 MED ORDER — HYDROCODONE-ACETAMINOPHEN 5-325 MG PO TABS: 1.0000 | ORAL_TABLET | ORAL | Status: DC | PRN

## 2018-11-21 MED ORDER — SUGAMMADEX SODIUM 200 MG/2ML IV SOLN
INTRAVENOUS | Status: DC | PRN
  Administered 2018-11-21: 140 mg via INTRAVENOUS

## 2018-11-21 MED ORDER — OXYCODONE HCL 5 MG/5ML PO SOLN: 5.0000 mg | Freq: Once | ORAL | Status: DC | PRN

## 2018-11-21 MED ORDER — ONDANSETRON HCL 4 MG/2ML IJ SOLN: 4.0000 mg | Freq: Four times a day (QID) | INTRAMUSCULAR | Status: DC | PRN

## 2018-11-21 MED ORDER — PIPERACILLIN-TAZOBACTAM 3.375 G IVPB
3.3750 g | Freq: Once | INTRAVENOUS | Status: AC
  Administered 2018-11-21: 10:00:00 3.375 g via INTRAVENOUS
  Filled 2018-11-21: qty 50

## 2018-11-21 MED ORDER — FENTANYL CITRATE (PF) 100 MCG/2ML IJ SOLN
INTRAMUSCULAR | Status: DC | PRN
  Administered 2018-11-21 (×2): 50 ug via INTRAVENOUS

## 2018-11-21 MED ORDER — PROMETHAZINE HCL 25 MG/ML IJ SOLN: 6.2500 mg | INTRAMUSCULAR | Status: DC | PRN

## 2018-11-21 MED ORDER — LIDOCAINE 2% (20 MG/ML) 5 ML SYRINGE
INTRAMUSCULAR | Status: DC | PRN
  Administered 2018-11-21: 40 mg via INTRAVENOUS
  Administered 2018-11-21: 20 mg via INTRAVENOUS

## 2018-11-21 MED ORDER — ONDANSETRON HCL 4 MG/2ML IJ SOLN
INTRAMUSCULAR | Status: DC | PRN
  Administered 2018-11-21: 4 mg via INTRAVENOUS

## 2018-11-21 MED ORDER — LACTATED RINGERS IV SOLN
INTRAVENOUS | Status: DC
  Administered 2018-11-21: 07:00:00 via INTRAVENOUS

## 2018-11-21 MED ORDER — ENSURE ENLIVE PO LIQD: 237.0000 mL | Freq: Two times a day (BID) | ORAL | Status: DC

## 2018-11-21 MED ORDER — PROPOFOL 10 MG/ML IV BOLUS
INTRAVENOUS | Status: DC | PRN
  Administered 2018-11-21: 120 mg via INTRAVENOUS
  Administered 2018-11-21: 40 mg via INTRAVENOUS

## 2018-11-21 MED ORDER — MIDAZOLAM HCL 2 MG/2ML IJ SOLN
INTRAMUSCULAR | Status: AC
  Filled 2018-11-21: qty 2

## 2018-11-21 MED ORDER — SODIUM CHLORIDE 0.9 % IV SOLN
INTRAVENOUS | Status: DC | PRN
  Administered 2018-11-21: 40 ug/min via INTRAVENOUS

## 2018-11-21 MED ORDER — ROCURONIUM BROMIDE 50 MG/5ML IV SOSY
PREFILLED_SYRINGE | INTRAVENOUS | Status: DC | PRN
  Administered 2018-11-21: 10 mg via INTRAVENOUS
  Administered 2018-11-21: 50 mg via INTRAVENOUS
  Administered 2018-11-21 (×2): 20 mg via INTRAVENOUS

## 2018-11-21 MED ORDER — SENNOSIDES-DOCUSATE SODIUM 8.6-50 MG PO TABS
1.0000 | ORAL_TABLET | Freq: Every day | ORAL | Status: DC | PRN
  Filled 2018-11-21: qty 1

## 2018-11-21 MED ORDER — FENTANYL CITRATE (PF) 250 MCG/5ML IJ SOLN
INTRAMUSCULAR | Status: AC
  Filled 2018-11-21: qty 5

## 2018-11-21 MED ORDER — SPIRONOLACTONE 25 MG PO TABS
50.0000 mg | ORAL_TABLET | Freq: Every day | ORAL | Status: DC
  Administered 2018-11-21: 50 mg via ORAL
  Filled 2018-11-21: qty 2

## 2018-11-21 MED ORDER — PHENYLEPHRINE 40 MCG/ML (10ML) SYRINGE FOR IV PUSH (FOR BLOOD PRESSURE SUPPORT)
PREFILLED_SYRINGE | INTRAVENOUS | Status: DC | PRN
  Administered 2018-11-21 (×2): 80 ug via INTRAVENOUS

## 2018-11-21 MED ORDER — ALBUMIN HUMAN 25 % IV SOLN
50.0000 g | Freq: Once | INTRAVENOUS | Status: AC
  Administered 2018-11-21 (×4): via INTRAVENOUS
  Filled 2018-11-21: qty 200

## 2018-11-21 NOTE — H&P (Deleted)
  The note originally documented on this encounter has been moved the the encounter in which it belongs.  

## 2018-11-21 NOTE — Anesthesia Postprocedure Evaluation (Signed)
Anesthesia Post Note  Patient: David Brown  Procedure(s) Performed: MICROWAVE THERMAL ABLATION LIVER (N/A )     Patient location during evaluation: PACU Anesthesia Type: General Level of consciousness: awake and alert Pain management: pain level controlled Vital Signs Assessment: post-procedure vital signs reviewed and stable Respiratory status: spontaneous breathing, nonlabored ventilation, respiratory function stable and patient connected to nasal cannula oxygen Cardiovascular status: blood pressure returned to baseline and stable Postop Assessment: no apparent nausea or vomiting Anesthetic complications: no    Last Vitals:  Vitals:   11/21/18 1330 11/21/18 1340  BP: 94/72 95/71  Pulse: 90 93  Resp: 13 13  Temp:    SpO2: 99% 99%    Last Pain:  Vitals:   11/21/18 1340  TempSrc:   PainSc: 0-No pain                 Jecenia Leamer DAVID

## 2018-11-21 NOTE — Procedures (Signed)
Interventional Radiology Procedure Note  Procedure: CT and US guided thermal ablation of right lobe HCC; paracentesis  Anesthesia: General  Complications: None  Estimated Blood Loss: < 10 mL  Findings: Paracentesis performed prior to ablation yielding 6.5 L of ascites.  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  Plan: PACU recovery followed by overnight observation.  Venetia Night. Kathlene Cote, M.D Pager:  [phone]
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
## 2018-11-21 NOTE — Anesthesia Procedure Notes (Signed)
Procedure Name: Intubation Date/Time: 11/21/2018 9:57 AM Performed by: Montel Clock, CRNA Pre-anesthesia Checklist: Patient identified, Emergency Drugs available, Suction available, Patient being monitored and Timeout performed Patient Re-evaluated:Patient Re-evaluated prior to induction Oxygen Delivery Method: Circle system utilized Preoxygenation: Pre-oxygenation with 100% oxygen Induction Type: IV induction Ventilation: Two handed mask ventilation required Laryngoscope Size: Mac and 3 Grade View: Grade II Tube type: Oral Tube size: 7.5 mm Number of attempts: 1 Airway Equipment and Method: Stylet Placement Confirmation: ETT inserted through vocal cords under direct vision,  positive ETCO2 and breath sounds checked- equal and bilateral Secured at: 22 cm Tube secured with: Tape Dental Injury: Teeth and Oropharynx as per pre-operative assessment  Comments: 2 handed mask required related to bony facial structure. Easy ventilation with 2 hands. ETT passed without difficulty.

## 2018-11-21 NOTE — Progress Notes (Signed)
Called MD concerning manual blood pressure of 88/60. Pt having no complaints.  Order given.

## 2018-11-21 NOTE — Transfer of Care (Signed)
Immediate Anesthesia Transfer of Care Note  Patient: David Brown  Procedure(s) Performed: MICROWAVE THERMAL ABLATION LIVER (N/A )  Patient Location: PACU  Anesthesia Type:General  Level of Consciousness: awake and patient cooperative  Airway & Oxygen Therapy: Patient Spontanous Breathing and Patient connected to face mask oxygen  Post-op Assessment: Report given to RN and Post -op Vital signs reviewed and stable  Post vital signs: Reviewed and stable  Last Vitals:  Vitals Value Taken Time  BP 120/76 11/21/18 1156  Temp    Pulse 101 11/21/18 1158  Resp 15 11/21/18 1158  SpO2 99 % 11/21/18 1158  Vitals shown include unvalidated device data.  Last Pain:  Vitals:   11/21/18 0703  TempSrc: Oral         Complications: No apparent anesthesia complications

## 2018-11-21 NOTE — Progress Notes (Signed)
  Post procedure rounds  Patient doing well. No complaints.  States abdomen feels much better (after para) and he is happy he can eat.  Probable D/C in am as long as no issues voiding.  Fillmore Bynum S Blessed Cotham PA-C 11/21/2018 3:40 PM

## 2018-11-22 ENCOUNTER — Encounter (HOSPITAL_COMMUNITY): Payer: Self-pay | Admitting: Interventional Radiology

## 2018-11-22 DIAGNOSIS — C220 Liver cell carcinoma: Secondary | ICD-10-CM | POA: Diagnosis not present

## 2018-11-22 LAB — CBC
HCT: 30.9 % — ABNORMAL LOW (ref 39.0–52.0)
Hemoglobin: 10.8 g/dL — ABNORMAL LOW (ref 13.0–17.0)
MCH: 34.2 pg — ABNORMAL HIGH (ref 26.0–34.0)
MCHC: 35 g/dL (ref 30.0–36.0)
MCV: 97.8 fL (ref 80.0–100.0)
Platelets: 95 10*3/uL — ABNORMAL LOW (ref 150–400)
RBC: 3.16 MIL/uL — ABNORMAL LOW (ref 4.22–5.81)
RDW: 14.9 % (ref 11.5–15.5)
WBC: 11.9 10*3/uL — ABNORMAL HIGH (ref 4.0–10.5)
nRBC: 0 % (ref 0.0–0.2)

## 2018-11-22 LAB — COMPREHENSIVE METABOLIC PANEL
ALT: 64 U/L — ABNORMAL HIGH (ref 0–44)
AST: 153 U/L — ABNORMAL HIGH (ref 15–41)
Albumin: 3 g/dL — ABNORMAL LOW (ref 3.5–5.0)
Alkaline Phosphatase: 64 U/L (ref 38–126)
Anion gap: 4 — ABNORMAL LOW (ref 5–15)
BUN: 23 mg/dL — ABNORMAL HIGH (ref 6–20)
CO2: 22 mmol/L (ref 22–32)
Calcium: 8.3 mg/dL — ABNORMAL LOW (ref 8.9–10.3)
Chloride: 103 mmol/L (ref 98–111)
Creatinine, Ser: 0.5 mg/dL — ABNORMAL LOW (ref 0.61–1.24)
GFR calc Af Amer: >60 mL/min (ref >60)
GFR calc non Af Amer: >60 mL/min (ref >60)
Glucose, Bld: 139 mg/dL — ABNORMAL HIGH (ref 70–99)
Potassium: 5.2 mmol/L — ABNORMAL HIGH (ref 3.5–5.1)
Sodium: 129 mmol/L — ABNORMAL LOW (ref 135–145)
Total Bilirubin: 2.5 mg/dL — ABNORMAL HIGH (ref 0.3–1.2)
Total Protein: 5.2 g/dL — ABNORMAL LOW (ref 6.5–8.1)

## 2018-11-22 NOTE — Plan of Care (Signed)
Reviewed discharge instructions with patient; copy given. IV removed. Patient ready for discharge.  

## 2018-11-22 NOTE — Discharge Summary (Signed)
[name] MRN: [id number] DOB/AGE: Apr 30, 1960 58 y.o.  Admit date: 11/21/2018 Discharge date: 11/22/2018  Supervising Physician: Jacqulynn Cadet  Patient Status: [facility] - In-pt  Admission Diagnoses:  Hepatocellular carcinoma Ascites  Discharge Diagnoses:  Active Problems:   Hepatocellular carcinoma [facility])   Discharged Condition: good  Hospital Course:   David Brown is a 58 y.o. male who presented on 7/7/2020with decompensated alcoholic cirrhosis and associated large volume ascites.   He has had several paracentesis procedures with removal of 5.6 L of fluid on 09/26/2018, 7 L of fluid on 10/05/2018 and 11.8 L of fluid on 10/22/2018.   During a paracentesis procedure in July there was incidental detection of a probable lesion within the liver which was also vaguely visible by CT on 09/25/2018.   PET scan on 10/08/2018 did not demonstrate any abnormal hypermetabolism.   MRI on 10/12/2018 demonstrated a 2.3 cm rounded lesion in segment VIwith imaging features compatible with hepatocellular carcinoma, LI-RADS5.  He was evaluate via virtual visit by Dr. Kathlene Cote on 10/23/2018 for evaluation for microwave ablation of the lesion.  He was deemed an appropriate candidate and underwent a successful microwave ablation yesterday.  Foley was unable to be placed due to urethral stricture, so condom catheter was placed instead.  He also had paracentesis prior to the procedure.  He has done very well post operatively.  He is tolerating a diet. He has ambulated. He has voided normally with no issues.   He is ready for discharge home.   Treatments:   CLINICAL DATA:  Cirrhosis, 2.3 cm rounded lesion in segment 6 of the right lobe of the liver consistent with hepatocellular carcinoma and recurrent ascites requiring multiple paracentesis procedures.  EXAM: CT-GUIDED PERCUTANEOUS THERMAL ABLATION OF HEPATOCELLULAR CARCINOMA  COMPARISON:  MRI of the  abdomen on 10/12/2018  ANESTHESIA/SEDATION: Anesthesia:  General  Medications: 3.375 g IV Zosyn. The antibiotic was administered in an appropriate time interval prior to needle puncture of the skin. The patient also received 50 g IV albumin during the procedure.  CONTRAST:  None.  PROCEDURE: The procedure, risks, benefits, and alternatives were explained to the patient. Questions regarding the procedure were encouraged and answered. The patient understands and consents to the procedure. A time-out was performed after placing the patient under general anesthesia.  Ultrasound was performed of the abdomen. The abdominal wall was prepped with chlorhexidine. Under ultrasound guidance, a 6 French Safe-T-Centesis catheter was advanced into the right mid peritoneal cavity. Large volume paracentesis was performed via vacuum bottles.  The right lateral abdominal wall was prepped with chlorhexidine in a sterile fashion, and a sterile drape was applied covering the operative field. A sterile gown and sterile gloves were used for the procedure.  Additional paracentesis was performed from a higher right abdominal approach under ultrasound guidance with advancement of a 6 French Safe-T-Centesis catheter adjacent to the right lobe of the liver. Additional paracentesis was performed via vacuum bottles.  Under ultrasound guidance, a NeuWave XT microwave thermal ablation probe was advanced into the right lobe of the liver and through the midportion of a discrete right hepatic mass. After confirming probe positioning, additional localization was performed with unenhanced CT.  Microwave thermal ablation was performed through the single probe at 65 watts for 8 minutes of treatment time. Monitoring of ablation was performed during the entire length of ablation under real-time ultrasound. The ablation probe was removed and the tract cauterized.  COMPLICATIONS: None  FINDINGS: Large  volume ascites initially  was prohibitively to allowing safe thermal ablation of the liver due to displacement of the liver significantly from the abdominal wall. Initial paracentesis was performed which became limited by the presence of fat and bowel abutting the paracentesis catheter in the infra hepatic space. For this reason, a second paracentesis catheter was placed along the lateral margin of the right lobe of the liver to allow further fluid removal prior to ablation. A total of 6.5 L of fluid was removed during the procedure.  By ultrasound, a rounded mass in the inferior right lobe of the liver measures approximately 2.3 x 2.0 x 2.2 cm and corresponds to the lesion identified by MRI consistent with hepatocellular carcinoma. Thermal ablation was successfully performed through a single probe with visualization of a thermal ablation zone by ultrasound encompassing the lesion and extending up to nearly the surface of the liver.  IMPRESSION: CT guided percutaneous thermal ablation of right lobe 2.3 cm hepatocellular carcinoma. The patient will be observed overnight.   Electronically Signed   By: Aletta Edouard M.D.   On: 11/21/2018 13:20   Discharge Exam: Blood pressure (!) 88/60, pulse 93, temperature 98 F (36.7 C), resp. rate 13, SpO2 99 %. Awake and alert NAD Abdominal sites look good Abdomen soft Heart RRR Lungs Clear   Disposition: Discharge disposition: 01-Home or Self Care     Our office will call patient with follow up instructions.  Discharge Instructions    Call MD for:  difficulty breathing, headache or visual disturbances   Complete by: As directed    Call MD for:  persistant nausea and vomiting   Complete by: As directed    Call MD for:  redness, tenderness, or signs of infection (pain, swelling, redness, odor or green/yellow discharge around incision site)   Complete by: As directed    Call MD for:  severe uncontrolled pain   Complete by:  As directed    Call MD for:  temperature >100.4   Complete by: As directed    Diet - low sodium heart healthy   Complete by: As directed    Increase activity slowly   Complete by: As directed      Allergies as of 11/22/2018   No Known Allergies     Medication List    TAKE these medications   Banophen 50 MG capsule Generic drug: diphenhydrAMINE Take 50 mg by mouth at bedtime.   multivitamin with minerals Tabs tablet Take 1 tablet by mouth daily.   spironolactone 50 MG tablet Commonly known as: Aldactone Take 1 tablet (50 mg total) by mouth daily.   traMADol 50 MG tablet Commonly known as: ULTRAM Take 1 tablet (50 mg total) by mouth every 6 (six) hours as needed. What changed: reasons to take this         Electronically Signed: Murrell Redden, PA-C 11/22/2018, 9:18 AM     I have spent Less Than 30 Minutes discharging David Brown.

## 2018-11-22 NOTE — Plan of Care (Signed)
Patient sitting edge of bed this morning; pain and nausea controlled at this time. No needs expressed; anticipating discharge this morning. Will continue to monitor.
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
## 2018-11-30 ENCOUNTER — Encounter: Payer: Self-pay | Admitting: Oncology

## 2018-11-30 ENCOUNTER — Inpatient Hospital Stay (HOSPITAL_BASED_OUTPATIENT_CLINIC_OR_DEPARTMENT_OTHER): Payer: Medicaid Other | Admitting: Hospice and Palliative Medicine

## 2018-11-30 ENCOUNTER — Inpatient Hospital Stay: Payer: Medicaid Other | Attending: Oncology | Admitting: Oncology

## 2018-11-30 ENCOUNTER — Other Ambulatory Visit: Payer: Self-pay

## 2018-11-30 VITALS — BP 106/76 | HR 93 | Temp 96.2°F | Resp 20 | Ht 67.0 in | Wt 144.7 lb

## 2018-11-30 DIAGNOSIS — D696 Thrombocytopenia, unspecified: Secondary | ICD-10-CM | POA: Insufficient documentation

## 2018-11-30 DIAGNOSIS — C220 Liver cell carcinoma: Secondary | ICD-10-CM

## 2018-11-30 DIAGNOSIS — F419 Anxiety disorder, unspecified: Secondary | ICD-10-CM | POA: Diagnosis not present

## 2018-11-30 DIAGNOSIS — Z515 Encounter for palliative care: Secondary | ICD-10-CM | POA: Diagnosis not present

## 2018-11-30 DIAGNOSIS — D649 Anemia, unspecified: Secondary | ICD-10-CM | POA: Diagnosis not present

## 2018-11-30 DIAGNOSIS — R188 Other ascites: Secondary | ICD-10-CM | POA: Insufficient documentation

## 2018-11-30 DIAGNOSIS — K7460 Unspecified cirrhosis of liver: Secondary | ICD-10-CM | POA: Insufficient documentation

## 2018-11-30 DIAGNOSIS — Z720 Tobacco use: Secondary | ICD-10-CM | POA: Diagnosis not present

## 2018-11-30 DIAGNOSIS — Z7189 Other specified counseling: Secondary | ICD-10-CM

## 2018-11-30 MED ORDER — ESCITALOPRAM OXALATE 10 MG PO TABS: 10.0000 mg | ORAL_TABLET | Freq: Every day | ORAL | 3 refills | Status: AC

## 2018-11-30 NOTE — Progress Notes (Signed)
Would like sister called during visit. Would like to have permission to take Aldactone at night as it makes him sleepy.
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
## 2018-12-04 ENCOUNTER — Other Ambulatory Visit: Payer: Self-pay | Admitting: Interventional Radiology

## 2018-12-04 DIAGNOSIS — C220 Liver cell carcinoma: Secondary | ICD-10-CM

## 2018-12-18 ENCOUNTER — Encounter: Payer: Self-pay | Admitting: Emergency Medicine

## 2018-12-18 ENCOUNTER — Other Ambulatory Visit: Payer: Self-pay

## 2018-12-18 ENCOUNTER — Emergency Department: Payer: Medicaid Other

## 2018-12-18 ENCOUNTER — Emergency Department
Admission: EM | Admit: 2018-12-18 | Discharge: 2018-12-18 | Disposition: A | Discharge status: Home / Self Care | Payer: Medicaid Other | Attending: Student | Admitting: Student

## 2018-12-18 DIAGNOSIS — R180 Malignant ascites: Secondary | ICD-10-CM

## 2018-12-18 DIAGNOSIS — R188 Other ascites: Secondary | ICD-10-CM | POA: Diagnosis not present

## 2018-12-18 DIAGNOSIS — Z79899 Other long term (current) drug therapy: Secondary | ICD-10-CM | POA: Insufficient documentation

## 2018-12-18 DIAGNOSIS — Z8673 Personal history of transient ischemic attack (TIA), and cerebral infarction without residual deficits: Secondary | ICD-10-CM | POA: Diagnosis not present

## 2018-12-18 DIAGNOSIS — F1210 Cannabis abuse, uncomplicated: Secondary | ICD-10-CM | POA: Diagnosis not present

## 2018-12-18 DIAGNOSIS — F17210 Nicotine dependence, cigarettes, uncomplicated: Secondary | ICD-10-CM | POA: Insufficient documentation

## 2018-12-18 DIAGNOSIS — R109 Unspecified abdominal pain: Secondary | ICD-10-CM | POA: Diagnosis present

## 2018-12-18 LAB — COMPREHENSIVE METABOLIC PANEL
ALT: 73 U/L — ABNORMAL HIGH (ref 0–44)
AST: 110 U/L — ABNORMAL HIGH (ref 15–41)
Albumin: 2.8 g/dL — ABNORMAL LOW (ref 3.5–5.0)
Alkaline Phosphatase: 86 U/L (ref 38–126)
Anion gap: 8 (ref 5–15)
BUN: 22 mg/dL — ABNORMAL HIGH (ref 6–20)
CO2: 22 mmol/L (ref 22–32)
Calcium: 8.3 mg/dL — ABNORMAL LOW (ref 8.9–10.3)
Chloride: 98 mmol/L (ref 98–111)
Creatinine, Ser: 0.77 mg/dL (ref 0.61–1.24)
GFR calc Af Amer: >60 mL/min (ref >60)
GFR calc non Af Amer: >60 mL/min (ref >60)
Glucose, Bld: 143 mg/dL — ABNORMAL HIGH (ref 70–99)
Potassium: 4 mmol/L (ref 3.5–5.1)
Sodium: 128 mmol/L — ABNORMAL LOW (ref 135–145)
Total Bilirubin: 4.5 mg/dL — ABNORMAL HIGH (ref 0.3–1.2)
Total Protein: 6 g/dL — ABNORMAL LOW (ref 6.5–8.1)

## 2018-12-18 LAB — CBC
HCT: 35 % — ABNORMAL LOW (ref 39.0–52.0)
Hemoglobin: 12.3 g/dL — ABNORMAL LOW (ref 13.0–17.0)
MCH: 33.7 pg (ref 26.0–34.0)
MCHC: 35.1 g/dL (ref 30.0–36.0)
MCV: 95.9 fL (ref 80.0–100.0)
Platelets: 148 10*3/uL — ABNORMAL LOW (ref 150–400)
RBC: 3.65 MIL/uL — ABNORMAL LOW (ref 4.22–5.81)
RDW: 14.4 % (ref 11.5–15.5)
WBC: 7.7 10*3/uL (ref 4.0–10.5)
nRBC: 0 % (ref 0.0–0.2)

## 2018-12-18 LAB — PROTIME-INR
INR: 1.4 — ABNORMAL HIGH (ref 0.8–1.2)
Prothrombin Time: 16.8 seconds — ABNORMAL HIGH (ref 11.4–15.2)

## 2018-12-18 LAB — LIPASE, BLOOD: Lipase: 34 U/L (ref 11–51)

## 2018-12-18 LAB — APTT: aPTT: 35 seconds (ref 24–36)

## 2018-12-18 MED ORDER — OXYCODONE-ACETAMINOPHEN 5-325 MG PO TABS
1.0000 | ORAL_TABLET | Freq: Once | ORAL | Status: AC
  Administered 2018-12-18: 18:00:00 1 via ORAL
  Filled 2018-12-18: qty 1

## 2018-12-18 NOTE — ED Notes (Signed)
Patient transported to Ultrasound for paracentesis at this time

## 2018-12-18 NOTE — ED Notes (Signed)
Blood collected by student kristen long 

## 2018-12-18 NOTE — ED Notes (Signed)
Pt gathered all personal belongings from room and removed them prior to ED departure  

## 2018-12-18 NOTE — ED Notes (Addendum)
Pt cleared by MD to eat and drink. Pt given [place] sandwich tray, and ice water to drink at this time. Pt also contacted family member via phone for transportation.

## 2018-12-18 NOTE — ED Triage Notes (Signed)
Patient reports worsening abdominal swelling and pain for the last 2 weeks. States he has history of liver cancer and multiple paracentesis done. Patient also reports abdominal hernia.

## 2018-12-18 NOTE — Discharge Instructions (Addendum)
Thank you for letting us take care of you in the emergency department today.  ° °Please continue to take any regular, prescribed medications.  ° °Please follow up with: °- Your primary care doctor to review your ER visit and follow up on your symptoms.  ° °Please return to the ER for any new or worsening symptoms.  ° °
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
## 2018-12-20 ENCOUNTER — Encounter: Payer: Self-pay | Admitting: *Deleted

## 2018-12-20 ENCOUNTER — Ambulatory Visit
Admission: RE | Admit: 2018-12-20 | Discharge: 2018-12-20 | Disposition: A | Discharge status: Home / Self Care | Payer: Self-pay | Source: Ambulatory Visit | Attending: Interventional Radiology | Admitting: Interventional Radiology

## 2018-12-20 DIAGNOSIS — C220 Liver cell carcinoma: Secondary | ICD-10-CM

## 2018-12-20 HISTORY — PX: IR RADIOLOGIST EVAL & MGMT: IMG5224
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
## 2018-12-22 ENCOUNTER — Encounter: Payer: Self-pay | Admitting: Emergency Medicine

## 2018-12-22 ENCOUNTER — Emergency Department: Payer: Medicaid Other

## 2018-12-22 ENCOUNTER — Other Ambulatory Visit: Payer: Self-pay

## 2018-12-22 ENCOUNTER — Inpatient Hospital Stay
Admission: EM | Admit: 2018-12-22 | Discharge: 2018-12-31 | DRG: 432 | Disposition: A | Discharge status: Hospice | Payer: Medicaid Other | Attending: Internal Medicine | Admitting: Internal Medicine

## 2018-12-22 DIAGNOSIS — K7460 Unspecified cirrhosis of liver: Secondary | ICD-10-CM

## 2018-12-22 DIAGNOSIS — F17210 Nicotine dependence, cigarettes, uncomplicated: Secondary | ICD-10-CM | POA: Diagnosis present

## 2018-12-22 DIAGNOSIS — K7031 Alcoholic cirrhosis of liver with ascites: Secondary | ICD-10-CM | POA: Diagnosis present

## 2018-12-22 DIAGNOSIS — Z79899 Other long term (current) drug therapy: Secondary | ICD-10-CM

## 2018-12-22 DIAGNOSIS — E871 Hypo-osmolality and hyponatremia: Secondary | ICD-10-CM | POA: Diagnosis present

## 2018-12-22 DIAGNOSIS — R188 Other ascites: Secondary | ICD-10-CM

## 2018-12-22 DIAGNOSIS — Z6820 Body mass index (BMI) 20.0-20.9, adult: Secondary | ICD-10-CM | POA: Diagnosis not present

## 2018-12-22 DIAGNOSIS — C220 Liver cell carcinoma: Secondary | ICD-10-CM | POA: Diagnosis present

## 2018-12-22 DIAGNOSIS — Z8673 Personal history of transient ischemic attack (TIA), and cerebral infarction without residual deficits: Secondary | ICD-10-CM | POA: Diagnosis not present

## 2018-12-22 DIAGNOSIS — E8770 Fluid overload, unspecified: Secondary | ICD-10-CM

## 2018-12-22 DIAGNOSIS — R627 Adult failure to thrive: Secondary | ICD-10-CM | POA: Diagnosis present

## 2018-12-22 DIAGNOSIS — K652 Spontaneous bacterial peritonitis: Secondary | ICD-10-CM | POA: Diagnosis present

## 2018-12-22 DIAGNOSIS — I959 Hypotension, unspecified: Secondary | ICD-10-CM | POA: Diagnosis not present

## 2018-12-22 DIAGNOSIS — E875 Hyperkalemia: Secondary | ICD-10-CM | POA: Diagnosis present

## 2018-12-22 DIAGNOSIS — F1020 Alcohol dependence, uncomplicated: Secondary | ICD-10-CM | POA: Diagnosis present

## 2018-12-22 DIAGNOSIS — Z66 Do not resuscitate: Secondary | ICD-10-CM | POA: Diagnosis present

## 2018-12-22 DIAGNOSIS — R1084 Generalized abdominal pain: Secondary | ICD-10-CM

## 2018-12-22 DIAGNOSIS — Z20828 Contact with and (suspected) exposure to other viral communicable diseases: Secondary | ICD-10-CM | POA: Diagnosis present

## 2018-12-22 DIAGNOSIS — F79 Unspecified intellectual disabilities: Secondary | ICD-10-CM | POA: Diagnosis present

## 2018-12-22 DIAGNOSIS — N179 Acute kidney failure, unspecified: Secondary | ICD-10-CM | POA: Diagnosis not present

## 2018-12-22 DIAGNOSIS — Z515 Encounter for palliative care: Secondary | ICD-10-CM | POA: Diagnosis present

## 2018-12-22 DIAGNOSIS — E785 Hyperlipidemia, unspecified: Secondary | ICD-10-CM | POA: Diagnosis not present

## 2018-12-22 LAB — BASIC METABOLIC PANEL
Anion gap: 11 (ref 5–15)
BUN: 31 mg/dL — ABNORMAL HIGH (ref 6–20)
CO2: 21 mmol/L — ABNORMAL LOW (ref 22–32)
Calcium: 8.9 mg/dL (ref 8.9–10.3)
Chloride: 92 mmol/L — ABNORMAL LOW (ref 98–111)
Creatinine, Ser: 1.2 mg/dL (ref 0.61–1.24)
GFR calc Af Amer: >60 mL/min (ref >60)
GFR calc non Af Amer: >60 mL/min (ref >60)
Glucose, Bld: 112 mg/dL — ABNORMAL HIGH (ref 70–99)
Potassium: 5.4 mmol/L — ABNORMAL HIGH (ref 3.5–5.1)
Sodium: 124 mmol/L — ABNORMAL LOW (ref 135–145)

## 2018-12-22 LAB — CBC WITH DIFFERENTIAL/PLATELET
Abs Immature Granulocytes: 0.09 10*3/uL — ABNORMAL HIGH (ref 0.00–0.07)
Basophils Absolute: 0.1 10*3/uL (ref 0.0–0.1)
Basophils Relative: 1 %
Eosinophils Absolute: 0 10*3/uL (ref 0.0–0.5)
Eosinophils Relative: 0 %
HCT: 41.1 % (ref 39.0–52.0)
Hemoglobin: 14.7 g/dL (ref 13.0–17.0)
Immature Granulocytes: 1 %
Lymphocytes Relative: 11 %
Lymphs Abs: 1.1 10*3/uL (ref 0.7–4.0)
MCH: 33.5 pg (ref 26.0–34.0)
MCHC: 35.8 g/dL (ref 30.0–36.0)
MCV: 93.6 fL (ref 80.0–100.0)
Monocytes Absolute: 0.5 10*3/uL (ref 0.1–1.0)
Monocytes Relative: 5 %
Neutro Abs: 9 10*3/uL — ABNORMAL HIGH (ref 1.7–7.7)
Neutrophils Relative %: 82 %
Platelets: 207 10*3/uL (ref 150–400)
RBC: 4.39 MIL/uL (ref 4.22–5.81)
RDW: 14.2 % (ref 11.5–15.5)
WBC: 10.8 10*3/uL — ABNORMAL HIGH (ref 4.0–10.5)
nRBC: 0 % (ref 0.0–0.2)

## 2018-12-22 LAB — HEPATIC FUNCTION PANEL
ALT: 78 U/L — ABNORMAL HIGH (ref 0–44)
AST: 132 U/L — ABNORMAL HIGH (ref 15–41)
Albumin: 2.8 g/dL — ABNORMAL LOW (ref 3.5–5.0)
Alkaline Phosphatase: 115 U/L (ref 38–126)
Bilirubin, Direct: 1.3 mg/dL — ABNORMAL HIGH (ref 0.0–0.2)
Indirect Bilirubin: 2.2 mg/dL — ABNORMAL HIGH (ref 0.3–0.9)
Total Bilirubin: 3.5 mg/dL — ABNORMAL HIGH (ref 0.3–1.2)
Total Protein: 6.6 g/dL (ref 6.5–8.1)

## 2018-12-22 LAB — ETHANOL: Alcohol, Ethyl (B): <10 mg/dL (ref <10)

## 2018-12-22 LAB — LIPASE, BLOOD: Lipase: 35 U/L (ref 11–51)

## 2018-12-22 MED ORDER — LORAZEPAM 2 MG/ML IJ SOLN
1.0000 mg | Freq: Once | INTRAMUSCULAR | Status: AC
  Administered 2018-12-22: 22:00:00 1 mg via INTRAVENOUS
  Filled 2018-12-22: qty 1

## 2018-12-22 MED ORDER — IOHEXOL 300 MG/ML  SOLN
100.0000 mL | Freq: Once | INTRAMUSCULAR | Status: AC | PRN
  Administered 2018-12-22: 100 mL via INTRAVENOUS

## 2018-12-22 MED ORDER — SODIUM CHLORIDE 0.9 % IV BOLUS
500.0000 mL | Freq: Once | INTRAVENOUS | Status: AC
  Administered 2018-12-22: 21:00:00 500 mL via INTRAVENOUS

## 2018-12-22 MED ORDER — PANTOPRAZOLE SODIUM 40 MG IV SOLR
40.0000 mg | Freq: Once | INTRAVENOUS | Status: AC
  Administered 2018-12-22: 21:00:00 40 mg via INTRAVENOUS
  Filled 2018-12-22: qty 40

## 2018-12-22 MED ORDER — SODIUM CHLORIDE 0.9 % IV BOLUS
1000.0000 mL | Freq: Once | INTRAVENOUS | Status: AC
  Administered 2018-12-22: 1000 mL via INTRAVENOUS

## 2018-12-22 MED ORDER — FENTANYL CITRATE (PF) 100 MCG/2ML IJ SOLN
50.0000 ug | Freq: Once | INTRAMUSCULAR | Status: AC
  Administered 2018-12-22: 50 ug via INTRAVENOUS
  Filled 2018-12-22: qty 2

## 2018-12-22 MED ORDER — SODIUM CHLORIDE 0.9 % IV SOLN
2.0000 g | Freq: Once | INTRAVENOUS | Status: AC
  Administered 2018-12-22: 2 g via INTRAVENOUS
  Filled 2018-12-22: qty 20
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
## 2018-12-22 NOTE — ED Notes (Signed)
Noel RN, aware of bed assigned  

## 2018-12-22 NOTE — ED Notes (Signed)
Maudie Mercury, RN refusing pt d/t MEWS score, pt is a 4 and floor won't accept 3 or greater, charge RN notifed
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
## 2018-12-22 NOTE — ED Triage Notes (Signed)
Pt presents with c/o coffee ground emesis today. Hx of liver cancer, cirrhosis. Had paracentesis 3 days ago with 12 L removed. guaic negative.
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
## 2018-12-23 ENCOUNTER — Encounter: Payer: Self-pay | Admitting: Registered Nurse

## 2018-12-23 ENCOUNTER — Encounter
Admission: EM | Disposition: A | Discharge status: Hospice | Payer: Self-pay | Source: Home / Self Care | Attending: Internal Medicine

## 2018-12-23 ENCOUNTER — Other Ambulatory Visit: Payer: Self-pay

## 2018-12-23 LAB — BODY FLUID CELL COUNT WITH DIFFERENTIAL
Eos, Fluid: 0 %
Lymphs, Fluid: 0 %
Monocyte-Macrophage-Serous Fluid: 4 %
Neutrophil Count, Fluid: 96 %
Other Cells, Fluid: 0 %
Total Nucleated Cell Count, Fluid: 10880 cu mm

## 2018-12-23 LAB — PROTIME-INR
INR: 1.5 — ABNORMAL HIGH (ref 0.8–1.2)
Prothrombin Time: 17.8 seconds — ABNORMAL HIGH (ref 11.4–15.2)

## 2018-12-23 LAB — COMPREHENSIVE METABOLIC PANEL
ALT: 69 U/L — ABNORMAL HIGH (ref 0–44)
AST: 116 U/L — ABNORMAL HIGH (ref 15–41)
Albumin: 2.5 g/dL — ABNORMAL LOW (ref 3.5–5.0)
Alkaline Phosphatase: 84 U/L (ref 38–126)
Anion gap: 12 (ref 5–15)
BUN: 35 mg/dL — ABNORMAL HIGH (ref 6–20)
CO2: 15 mmol/L — ABNORMAL LOW (ref 22–32)
Calcium: 8.5 mg/dL — ABNORMAL LOW (ref 8.9–10.3)
Chloride: 96 mmol/L — ABNORMAL LOW (ref 98–111)
Creatinine, Ser: 1.45 mg/dL — ABNORMAL HIGH (ref 0.61–1.24)
GFR calc Af Amer: >60 mL/min (ref >60)
GFR calc non Af Amer: 53 mL/min — ABNORMAL LOW (ref >60)
Glucose, Bld: 104 mg/dL — ABNORMAL HIGH (ref 70–99)
Potassium: 5.6 mmol/L — ABNORMAL HIGH (ref 3.5–5.1)
Sodium: 123 mmol/L — ABNORMAL LOW (ref 135–145)
Total Bilirubin: 3.9 mg/dL — ABNORMAL HIGH (ref 0.3–1.2)
Total Protein: 6.1 g/dL — ABNORMAL LOW (ref 6.5–8.1)

## 2018-12-23 LAB — CBC
HCT: 42.7 % (ref 39.0–52.0)
Hemoglobin: 15 g/dL (ref 13.0–17.0)
MCH: 33.3 pg (ref 26.0–34.0)
MCHC: 35.1 g/dL (ref 30.0–36.0)
MCV: 94.9 fL (ref 80.0–100.0)
Platelets: 205 10*3/uL (ref 150–400)
RBC: 4.5 MIL/uL (ref 4.22–5.81)
RDW: 14.4 % (ref 11.5–15.5)
WBC: 22.2 10*3/uL — ABNORMAL HIGH (ref 4.0–10.5)
nRBC: 0 % (ref 0.0–0.2)

## 2018-12-23 LAB — MRSA PCR SCREENING: MRSA by PCR: NEGATIVE

## 2018-12-23 LAB — GLUCOSE, CAPILLARY: Glucose-Capillary: 91 mg/dL (ref 70–99)

## 2018-12-23 LAB — AMMONIA: Ammonia: 49 umol/L — ABNORMAL HIGH (ref 9–35)

## 2018-12-23 LAB — SARS CORONAVIRUS 2 (TAT 6-24 HRS): SARS Coronavirus 2: NEGATIVE

## 2018-12-23 LAB — SARS CORONAVIRUS 2 BY RT PCR (HOSPITAL ORDER, PERFORMED IN ~~LOC~~ HOSPITAL LAB): SARS Coronavirus 2: NEGATIVE

## 2018-12-23 SURGERY — ESOPHAGOGASTRODUODENOSCOPY (EGD) WITH PROPOFOL
Anesthesia: Monitor Anesthesia Care

## 2018-12-23 MED ORDER — PANTOPRAZOLE SODIUM 40 MG IV SOLR
40.0000 mg | Freq: Two times a day (BID) | INTRAVENOUS | Status: DC
  Administered 2018-12-23 – 2018-12-28 (×12): 40 mg via INTRAVENOUS
  Filled 2018-12-23 (×12): qty 40

## 2018-12-23 MED ORDER — CHLORHEXIDINE GLUCONATE CLOTH 2 % EX PADS: 6.0000 | MEDICATED_PAD | Freq: Every day | CUTANEOUS | Status: DC

## 2018-12-23 MED ORDER — ONDANSETRON HCL 4 MG PO TABS: 4.0000 mg | ORAL_TABLET | Freq: Four times a day (QID) | ORAL | Status: DC | PRN

## 2018-12-23 MED ORDER — ALBUMIN HUMAN 25 % IV SOLN
12.5000 g | Freq: Once | INTRAVENOUS | Status: AC
  Administered 2018-12-23: 12.5 g via INTRAVENOUS
  Filled 2018-12-23: qty 50

## 2018-12-23 MED ORDER — SODIUM CHLORIDE 0.9 % IV SOLN
2.0000 g | INTRAVENOUS | Status: DC
  Filled 2018-12-23: qty 20

## 2018-12-23 MED ORDER — ALBUMIN HUMAN 25 % IV SOLN
12.5000 g | INTRAVENOUS | Status: AC
  Administered 2018-12-23: 12.5 g via INTRAVENOUS
  Filled 2018-12-23: qty 50

## 2018-12-23 MED ORDER — TRAMADOL HCL 50 MG PO TABS
50.0000 mg | ORAL_TABLET | Freq: Four times a day (QID) | ORAL | Status: DC | PRN
  Administered 2018-12-23 – 2018-12-30 (×6): 50 mg via ORAL
  Filled 2018-12-23 (×6): qty 1

## 2018-12-23 MED ORDER — ONDANSETRON HCL 4 MG/2ML IJ SOLN
4.0000 mg | Freq: Four times a day (QID) | INTRAMUSCULAR | Status: DC | PRN
  Administered 2018-12-28: 4 mg via INTRAVENOUS
  Filled 2018-12-23: qty 2

## 2018-12-23 MED ORDER — ORAL CARE MOUTH RINSE
15.0000 mL | Freq: Two times a day (BID) | OROMUCOSAL | Status: DC
  Administered 2018-12-24: 15 mL via OROMUCOSAL

## 2018-12-23 MED ORDER — ESCITALOPRAM OXALATE 10 MG PO TABS
10.0000 mg | ORAL_TABLET | Freq: Every day | ORAL | Status: DC
  Administered 2018-12-24 – 2018-12-25 (×2): 10 mg via ORAL
  Filled 2018-12-23 (×3): qty 1

## 2018-12-23 MED ORDER — PIPERACILLIN-TAZOBACTAM 3.375 G IVPB: 3.3750 g | Freq: Three times a day (TID) | INTRAVENOUS | Status: DC

## 2018-12-23 MED ORDER — METRONIDAZOLE IN NACL 5-0.79 MG/ML-% IV SOLN
500.0000 mg | Freq: Three times a day (TID) | INTRAVENOUS | Status: DC
  Administered 2018-12-23 – 2018-12-29 (×18): 500 mg via INTRAVENOUS
  Filled 2018-12-23 (×22): qty 100

## 2018-12-23 MED ORDER — SODIUM CHLORIDE 0.9 % IV SOLN
2.0000 g | Freq: Every day | INTRAVENOUS | Status: DC
  Administered 2018-12-23 – 2018-12-28 (×6): 2 g via INTRAVENOUS
  Filled 2018-12-23: qty 2
  Filled 2018-12-23 (×2): qty 20
  Filled 2018-12-23: qty 2
  Filled 2018-12-23 (×2): qty 20
  Filled 2018-12-23: qty 2
  Filled 2018-12-23 (×2): qty 20
  Filled 2018-12-23: qty 2

## 2018-12-23 MED ORDER — ACETAMINOPHEN 325 MG PO TABS: 650.0000 mg | ORAL_TABLET | Freq: Four times a day (QID) | ORAL | Status: DC | PRN

## 2018-12-23 MED ORDER — ACETAMINOPHEN 650 MG RE SUPP: 650.0000 mg | Freq: Four times a day (QID) | RECTAL | Status: DC | PRN

## 2018-12-23 NOTE — Progress Notes (Signed)
[place] at [place] NAME: David Brown    MR#:  [id number]  DATE OF BIRTH:  04-15-1960  SUBJECTIVE:  CHIEF COMPLAINT:   Patient is resting comfortably reports he needs paracentesis every 7 to 10 days, denies any hematemesis or coffee-ground emesis today.  No family members at bedside  REVIEW OF SYSTEMS:  CONSTITUTIONAL: No fever, fatigue or weakness.  EYES: No blurred or double vision.  EARS, NOSE, AND THROAT: No tinnitus or ear pain.  RESPIRATORY: No cough, shortness of breath, wheezing or hemoptysis.  CARDIOVASCULAR: No chest pain, orthopnea, edema.  GASTROINTESTINAL: No nausea, had coffee-ground vomiting, denies diarrhea or abdominal pain.  Has abdominal distention from cirrhosis GENITOURINARY: No dysuria, hematuria.  ENDOCRINE: No polyuria, nocturia,  HEMATOLOGY: No anemia, easy bruising or bleeding SKIN: No rash or lesion. MUSCULOSKELETAL: No joint pain or arthritis.   NEUROLOGIC: No tingling, numbness, weakness.  PSYCHIATRY: No anxiety or depression.   DRUG ALLERGIES:  No Known Allergies  VITALS:  Blood pressure 97/62, pulse (!) 112, temperature 99 F (37.2 C), temperature source Oral, resp. rate 17, height 5\' 6"  (1.676 m), weight 61.2 kg, SpO2 98 %.  PHYSICAL EXAMINATION:  GENERAL:  58 y.o.-year-old patient lying in the bed with no acute distress.  Emaciated EYES: Pupils equal, round, reactive to light and accommodation. No scleral icterus. Extraocular muscles intact.  HEENT: Head atraumatic, normocephalic. Oropharynx and nasopharynx clear.  NECK:  Supple, no jugular venous distention. No thyroid enlargement, no tenderness.  LUNGS: Normal breath sounds bilaterally, no wheezing, rales,rhonchi or crepitation. No use of accessory muscles of respiration.  CARDIOVASCULAR: S1, S2 normal. No murmurs, rubs, or gallops.  ABDOMEN: Soft, nontender, distended with ascites. Bowel sounds present.  EXTREMITIES: No pedal edema,  cyanosis, or clubbing.  NEUROLOGIC: Cranial nerves II through XII are intact. Sensation intact. Gait not checked.  PSYCHIATRIC: The patient is alert and oriented x 3.  SKIN: No obvious rash, lesion, or ulcer.    LABORATORY PANEL:   CBC Recent Labs  Lab 12/23/18 0545  WBC 22.2*  HGB 15.0  HCT 42.7  PLT 205   ------------------------------------------------------------------------------------------------------------------  Chemistries  Recent Labs  Lab 12/23/18 0545  NA 123*  K 5.6*  CL 96*  CO2 15*  GLUCOSE 104*  BUN 35*  CREATININE 1.45*  CALCIUM 8.5*  AST 116*  ALT 69*  ALKPHOS 84  BILITOT 3.9*   ------------------------------------------------------------------------------------------------------------------  Cardiac Enzymes No results for input(s): TROPONINI in the last 168 hours. ------------------------------------------------------------------------------------------------------------------  RADIOLOGY:  Ct Abdomen Pelvis W Contrast  Result Date: 12/22/2018 CLINICAL DATA:  58 year old male with history of acute generalized abdominal pain. Coffee ground emesis today. History of liver cancer. Cirrhosis. EXAM: CT ABDOMEN AND PELVIS WITH CONTRAST TECHNIQUE: Multidetector CT imaging of the abdomen and pelvis was performed using the standard protocol following bolus administration of intravenous contrast. CONTRAST:  126mL OMNIPAQUE IOHEXOL 300 MG/ML  SOLN COMPARISON:  CT the abdomen and pelvis 09/25/2018. FINDINGS: Lower chest: Mild linear scarring or subsegmental atelectasis in the right lower lobe. Aortic atherosclerosis. Hepatobiliary: Liver has a shrunken appearance and nodular contour, indicative of advanced cirrhosis. In segment 5 of the liver (axial image 27 of series 2 and coronal image 54 of series 5) there is a new 3.4 x 3.3 x 3.1 cm hypovascular area which corresponds to the recently ablated lesion site (patient underwent thermal ablation of a lesion in this  region on 11/21/2018). No other suspicious appearing hepatic lesions are noted. No intra or extrahepatic  biliary ductal dilatation. Gallbladder is nearly decompressed and otherwise unremarkable in appearance. Pancreas: No pancreatic mass. No pancreatic ductal dilatation. No pancreatic or peripancreatic fluid collections or inflammatory changes. Spleen: Unremarkable. Adrenals/Urinary Tract: Bilateral kidneys and adrenal glands are normal in appearance. No hydroureteronephrosis. Urinary bladder is normal in appearance. Stomach/Bowel: Stomach is grossly unremarkable in appearance. No pathologic dilatation of small bowel or colon. Fatty mural thickening in the cecum, ascending colon and proximal transverse colon, nonspecific. Appendix is not confidently identified. Vascular/Lymphatic: Aortic atherosclerosis, without evidence of aneurysm or dissection in the abdominal or pelvic vasculature. No lymphadenopathy noted in the abdomen or pelvis. Reproductive: Prostate gland and seminal vesicles are unremarkable in appearance. Other: Large volume of ascites. No pneumoperitoneum. Small right inguinal hernia containing ascites. Musculoskeletal: There are no aggressive appearing lytic or blastic lesions noted in the visualized portions of the skeleton. IMPRESSION: 1. Large volume of ascites. 2. Post ablation changes in the right lobe of the liver, as above. 3. Advanced changes of cirrhosis redemonstrated. 4. Aortic atherosclerosis. 5. Additional incidental findings, as above. Electronically Signed   By: Vinnie Langton M.D.   On: 12/22/2018 22:07    EKG:   Orders placed or performed during the hospital encounter of 12/22/18  . ED EKG  . ED EKG    ASSESSMENT AND PLAN:   #Coffee-ground emesis Hemodynamically patient is stable Seen by gastroenterology Dr. 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 [name] Child Pugh Class "C" cirrhosis with 10 points  #Hyponatremia-secondary to volume overload and underlying hepatocellular carcinoma  #Hepatocellular carcinoma status post ablation follow-up with  hip neurologist as an outpatient including potential transplantation of the liver   Plan discussed with intensivist and RN  All the records are reviewed and case discussed with Care Management/Social Workerr. Management plans discussed with the patient CODE STATUS: FC   TOTAL TIME TAKING CARE OF THIS PATIENT: 35  minutes.   POSSIBLE D/C IN 1-2DAYS, DEPENDING ON CLINICAL CONDITION.  Note: This dictation was prepared with Dragon dictation along with smaller phrase technology. Any transcriptional errors that result from this process are unintentional.   [name] M.D on 12/23/2018 at 2:19 PM  Between 7am to 6pm - Pager - [phone] After 6pm go to [email or web address] - password [email or web address] [facility]  Office  [phone]  CC: Primary care physician; Patient, No Pcp Per

## 2018-12-23 NOTE — Progress Notes (Signed)
Pt transferred from the ICU at 1539. Pt was A&Ox4. Pt c/o 6/10 abdominal pain and prn pain medication was given. Pt was a MEWs score of 3 due to HR in the 110s-120s and SBP in the 90s. Dr. Margaretmary Eddy is aware and just wants to monitor pt. No new orders at this time. Will continue to monitor.

## 2018-12-23 NOTE — Procedures (Signed)
    Paracentesis Procedure Note   Paracentesis Procedure Note    INDICATION: Ascites  PROCEDURE OPERATOR: Shandi Godfrey MD  Ultrasound used to mark location: Yes    CONSENT:   Consent was obtained from patient prior to the procedure. Indications, risks, and benefits were explained at length.       PROCEDURE SUMMARY:   A time-out was performed. My hands were washed immediately prior to the procedure. I wore a surgical cap, mask with protective eyewear, sterile gown and sterile gloves throughout the procedure. The area was cleansed and draped in usual sterile fashion using chlorhexidine scrub. Anesthesia was achieved with 1% lidocaine. The right side of the abdomen was prepped and draped in a sterile fashion using chlorhexidine scrub. 1% lidocaine was used to numb the skin, soft tissue and peritoneum. The paracentesis catheter was inserted and advanced with negative pressure until amber colored fluid was aspirated. Approximately 60 mL of ascitic fluid was collected and sent for laboratory analysis. The catheter was then connected to the vaccutainer and 6 liters of additional ascitic fluid were drained. The catheter was removed and no leaking was noted. A bandaid was placed over the puncture wound. The patient tolerated the procedure well without any immediate complications. Estimated blood loss was none.         Ottie Glazier, M.D.  [facility] [place]
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
## 2018-12-23 NOTE — ED Notes (Signed)
Bed placement [facility]) aware of order change

## 2018-12-23 NOTE — ED Notes (Signed)
This RN transported pt to ICU 13.
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
## 2018-12-24 ENCOUNTER — Encounter: Payer: Self-pay | Admitting: Registered Nurse

## 2018-12-24 DIAGNOSIS — E785 Hyperlipidemia, unspecified: Secondary | ICD-10-CM

## 2018-12-24 LAB — COMPREHENSIVE METABOLIC PANEL
ALT: 55 U/L — ABNORMAL HIGH (ref 0–44)
AST: 87 U/L — ABNORMAL HIGH (ref 15–41)
Albumin: 2.6 g/dL — ABNORMAL LOW (ref 3.5–5.0)
Alkaline Phosphatase: 57 U/L (ref 38–126)
Anion gap: 6 (ref 5–15)
BUN: 49 mg/dL — ABNORMAL HIGH (ref 6–20)
CO2: 21 mmol/L — ABNORMAL LOW (ref 22–32)
Calcium: 8.4 mg/dL — ABNORMAL LOW (ref 8.9–10.3)
Chloride: 93 mmol/L — ABNORMAL LOW (ref 98–111)
Creatinine, Ser: 1.76 mg/dL — ABNORMAL HIGH (ref 0.61–1.24)
GFR calc Af Amer: 49 mL/min — ABNORMAL LOW (ref >60)
GFR calc non Af Amer: 42 mL/min — ABNORMAL LOW (ref >60)
Glucose, Bld: 98 mg/dL (ref 70–99)
Potassium: 6.6 mmol/L (ref 3.5–5.1)
Sodium: 120 mmol/L — ABNORMAL LOW (ref 135–145)
Total Bilirubin: 2.5 mg/dL — ABNORMAL HIGH (ref 0.3–1.2)
Total Protein: 5.2 g/dL — ABNORMAL LOW (ref 6.5–8.1)

## 2018-12-24 LAB — GLUCOSE, CAPILLARY: Glucose-Capillary: 57 mg/dL — ABNORMAL LOW (ref 70–99)

## 2018-12-24 LAB — CBC
HCT: 31.9 % — ABNORMAL LOW (ref 39.0–52.0)
Hemoglobin: 11.6 g/dL — ABNORMAL LOW (ref 13.0–17.0)
MCH: 33.5 pg (ref 26.0–34.0)
MCHC: 36.4 g/dL — ABNORMAL HIGH (ref 30.0–36.0)
MCV: 92.2 fL (ref 80.0–100.0)
Platelets: 121 10*3/uL — ABNORMAL LOW (ref 150–400)
RBC: 3.46 MIL/uL — ABNORMAL LOW (ref 4.22–5.81)
RDW: 14.2 % (ref 11.5–15.5)
WBC: 18.8 10*3/uL — ABNORMAL HIGH (ref 4.0–10.5)
nRBC: 0 % (ref 0.0–0.2)

## 2018-12-24 LAB — NA AND K (SODIUM & POTASSIUM), RAND UR
Potassium Urine: 64 mmol/L
Sodium, Ur: <10 mmol/L

## 2018-12-24 LAB — POTASSIUM
Potassium: 5.5 mmol/L — ABNORMAL HIGH (ref 3.5–5.1)
Potassium: 5.1 mmol/L (ref 3.5–5.1)

## 2018-12-24 LAB — HEMOGLOBIN AND HEMATOCRIT, BLOOD
HCT: 33 % — ABNORMAL LOW (ref 39.0–52.0)
HCT: 33.6 % — ABNORMAL LOW (ref 39.0–52.0)
Hemoglobin: 11.8 g/dL — ABNORMAL LOW (ref 13.0–17.0)
Hemoglobin: 12.2 g/dL — ABNORMAL LOW (ref 13.0–17.0)

## 2018-12-24 LAB — SODIUM
Sodium: 117 mmol/L — CL (ref 135–145)
Sodium: 117 mmol/L — CL (ref 135–145)

## 2018-12-24 LAB — OSMOLALITY, URINE: Osmolality, Ur: 595 mOsm/kg (ref 300–900)

## 2018-12-24 LAB — AMMONIA: Ammonia: 38 umol/L — ABNORMAL HIGH (ref 9–35)

## 2018-12-24 MED ORDER — INSULIN ASPART 100 UNIT/ML IV SOLN
10.0000 [IU] | Freq: Once | INTRAVENOUS | Status: AC
  Administered 2018-12-24: 10 [IU] via INTRAVENOUS
  Filled 2018-12-24: qty 0.1

## 2018-12-24 MED ORDER — SODIUM ZIRCONIUM CYCLOSILICATE 10 G PO PACK
10.0000 g | PACK | Freq: Every day | ORAL | Status: DC
  Administered 2018-12-24 – 2018-12-26 (×3): 10 g via ORAL
  Filled 2018-12-24 (×3): qty 1

## 2018-12-24 MED ORDER — SODIUM CHLORIDE 0.9 % IV SOLN
INTRAVENOUS | Status: DC
  Administered 2018-12-24: 09:00:00 via INTRAVENOUS

## 2018-12-24 MED ORDER — DEXTROSE 50 % IV SOLN
1.0000 | Freq: Once | INTRAVENOUS | Status: AC
  Administered 2018-12-24: 50 mL via INTRAVENOUS
  Filled 2018-12-24: qty 50

## 2018-12-24 MED ORDER — SODIUM ZIRCONIUM CYCLOSILICATE 5 G PO PACK
5.0000 g | PACK | Freq: Once | ORAL | Status: AC
  Administered 2018-12-24: 5 g via ORAL
  Filled 2018-12-24: qty 1
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
## 2018-12-24 NOTE — Progress Notes (Addendum)
[place] [place] NAME: David Brown    MR#:  [id number]  DATE OF BIRTH:  1960-09-25  SUBJECTIVE:  CHIEF COMPLAINT:   Patient is lethargic but arousable, denies any bleeding today denies any hematemesis , not sure about the palliative care consult   No family members at bedside Anticipating EGD tomorrow, patients EGD got delayed as patient was noncompliant with n.p.o. status  REVIEW OF SYSTEMS:  CONSTITUTIONAL: No fever, fatigue or weakness.  EYES: No blurred or double vision.  EARS, NOSE, AND THROAT: No tinnitus or ear pain.  RESPIRATORY: No cough, shortness of breath, wheezing or hemoptysis.  CARDIOVASCULAR: No chest pain, orthopnea, edema.  GASTROINTESTINAL: No nausea, had coffee-ground vomiting, denies diarrhea or abdominal pain.  Has abdominal distention from cirrhosis GENITOURINARY: No dysuria, hematuria.  ENDOCRINE: No polyuria, nocturia,  HEMATOLOGY: No anemia, easy bruising or bleeding SKIN: No rash or lesion. MUSCULOSKELETAL: No joint pain or arthritis.   NEUROLOGIC: No tingling, numbness, weakness.  PSYCHIATRY: No anxiety or depression.   DRUG ALLERGIES:  No Known Allergies  VITALS:  Blood pressure 97/72, pulse (!) 104, temperature 98.2 F (36.8 C), temperature source Oral, resp. rate 19, height 5\' 6"  (1.676 m), weight 57.9 kg, SpO2 100 %.  PHYSICAL EXAMINATION:  GENERAL:  58 y.o.-year-old patient lying in the bed with no acute distress.  Emaciated EYES: Pupils equal, round, reactive to light and accommodation. No scleral icterus. Extraocular muscles intact.  HEENT: Head atraumatic, normocephalic. Oropharynx and nasopharynx clear.  NECK:  Supple, no jugular venous distention. No thyroid enlargement, no tenderness.  LUNGS: Normal breath sounds bilaterally, no wheezing, rales,rhonchi or crepitation. No use of accessory muscles of respiration.  CARDIOVASCULAR: S1, S2 normal. No murmurs, rubs, or gallops.   ABDOMEN: Soft, nontender, distended with ascites. Bowel sounds present.  EXTREMITIES: No pedal edema, cyanosis, or clubbing.  NEUROLOGIC: Cranial nerves II through XII are intact. Sensation intact. Gait not checked.  PSYCHIATRIC: The patient is alert and oriented x 3.  SKIN: No obvious rash, lesion, or ulcer.    LABORATORY PANEL:   CBC Recent Labs  Lab 12/24/18 0430  WBC 18.8*  HGB 11.6*  HCT 31.9*  PLT 121*   ------------------------------------------------------------------------------------------------------------------  Chemistries  Recent Labs  Lab 12/24/18 0430  NA 120*  K 6.6*  CL 93*  CO2 21*  GLUCOSE 98  BUN 49*  CREATININE 1.76*  CALCIUM 8.4*  AST 87*  ALT 55*  ALKPHOS 57  BILITOT 2.5*   ------------------------------------------------------------------------------------------------------------------  Cardiac Enzymes No results for input(s): TROPONINI in the last 168 hours. ------------------------------------------------------------------------------------------------------------------  RADIOLOGY:  Ct Abdomen Pelvis W Contrast  Result Date: 12/22/2018 CLINICAL DATA:  58 year old male with history of acute generalized abdominal pain. Coffee ground emesis today. History of liver cancer. Cirrhosis. EXAM: CT ABDOMEN AND PELVIS WITH CONTRAST TECHNIQUE: Multidetector CT imaging of the abdomen and pelvis was performed using the standard protocol following bolus administration of intravenous contrast. CONTRAST:  115mL OMNIPAQUE IOHEXOL 300 MG/ML  SOLN COMPARISON:  CT the abdomen and pelvis 09/25/2018. FINDINGS: Lower chest: Mild linear scarring or subsegmental atelectasis in the right lower lobe. Aortic atherosclerosis. Hepatobiliary: Liver has a shrunken appearance and nodular contour, indicative of advanced cirrhosis. In segment 5 of the liver (axial image 27 of series 2 and coronal image 54 of series 5) there is a new 3.4 x 3.3 x 3.1 cm hypovascular area which  corresponds to the recently ablated lesion site (patient underwent thermal ablation of a lesion in this  region on 11/21/2018). No other suspicious appearing hepatic lesions are noted. No intra or extrahepatic biliary ductal dilatation. Gallbladder is nearly decompressed and otherwise unremarkable in appearance. Pancreas: No pancreatic mass. No pancreatic ductal dilatation. No pancreatic or peripancreatic fluid collections or inflammatory changes. Spleen: Unremarkable. Adrenals/Urinary Tract: Bilateral kidneys and adrenal glands are normal in appearance. No hydroureteronephrosis. Urinary bladder is normal in appearance. Stomach/Bowel: Stomach is grossly unremarkable in appearance. No pathologic dilatation of small bowel or colon. Fatty mural thickening in the cecum, ascending colon and proximal transverse colon, nonspecific. Appendix is not confidently identified. Vascular/Lymphatic: Aortic atherosclerosis, without evidence of aneurysm or dissection in the abdominal or pelvic vasculature. No lymphadenopathy noted in the abdomen or pelvis. Reproductive: Prostate gland and seminal vesicles are unremarkable in appearance. Other: Large volume of ascites. No pneumoperitoneum. Small right inguinal hernia containing ascites. Musculoskeletal: There are no aggressive appearing lytic or blastic lesions noted in the visualized portions of the skeleton. IMPRESSION: 1. Large volume of ascites. 2. Post ablation changes in the right lobe of the liver, as above. 3. Advanced changes of cirrhosis redemonstrated. 4. Aortic atherosclerosis. 5. Additional incidental findings, as above. Electronically Signed   By: Vinnie Langton M.D.   On: 12/22/2018 22:07    EKG:   Orders placed or performed during the hospital encounter of 12/22/18  . ED EKG  . ED EKG  . EKG 12-Lead  . EKG 12-Lead  . EKG 12-Lead  . EKG 12-Lead    ASSESSMENT AND PLAN:   #Coffee-ground emesis Hemodynamically patient is stable Hemoglobin trended down  from 15-11.6 Seen by gastroenterology Dr. Alice Reichert , EGD got canceled as the patient had breakfast today morning.  Patient will be n.p.o. after midnight for EGD in a.m. Monitor hemoglobin and transfuse as needed.  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 [name] Child Pugh Class "C" cirrhosis with 10 points  #Acute kidney injury- Hydrate with IV fluids gently and avoid nephrotoxins Nephrology consulted  #Hyponatremia-secondary to volume overload and underlying hepatocellular carcinoma Sodium at 123 -120 Gentle hydration with IV fluids Check random urine sodium and urine osmolality, serum osmolality Nephrology consult placed and notified to Dr. Candiss Norse is aware of the consult  #Hepatocellular carcinoma status post ablation follow-up with  hepatologist as an outpatient including potential transplantation of the liver Follow-up with primary oncologist Dr. Grayland Ormond after discharge Patient is not sure about palliative care consult  Plan discussed with  RN  All the records are reviewed and case discussed with Care Management/Social Workerr. Management plans discussed with the patient CODE STATUS: FC   TOTAL TIME TAKING CARE OF THIS PATIENT: 35  minutes.   POSSIBLE D/C IN 2DAYS, DEPENDING ON CLINICAL CONDITION.  Note: This dictation was prepared with Dragon dictation along with smaller phrase technology. Any transcriptional errors that result from this process are unintentional.   [name] M.D on 12/24/2018 at 12:32 PM  Between 7am to 6pm - Pager - [phone] After 6pm go to [email or web address] - password [email or web address] [facility]  Office   [phone]  CC: Primary care physician; Patient, No Pcp Per

## 2018-12-24 NOTE — Progress Notes (Signed)
[facility]enterology Inpatient Progress Note  Subjective: Patient seen for f/u coffee ground emesis, currently stable and asymptomatic. We had to cancel EGD due to electrolyte imbalances (Hyponatremia 120, Hyperkalemia 6.6). Patient s/p Large volume paracentesis showing some SBP. No on Ceftriaxone per RN. No recurrent bleeding. Patient c/o mild forgetfulness, Ammonia only 38. Reportedly stopped drinking ETOH about 4 months ago per sister, Mardene Celeste.   Objective: Vital signs in last 24 hours: Temp:  [98.2 F (36.8 C)-99.1 F (37.3 C)] 98.7 F (37.1 C) (10/05 1600) Pulse Rate:  [103-194] 103 (10/05 1600) Resp:  [16-20] 19 (10/05 1600) BP: (81-148)/(59-128) 85/71 (10/05 1600) SpO2:  [95 %-100 %] 98 % (10/05 1600) Weight:  [57.9 kg] 57.9 kg (10/05 0025) Blood pressure (!) 85/71, pulse (!) 103, temperature 98.7 F (37.1 C), temperature source Oral, resp. rate 19, height 5\' 6"  (1.676 m), weight 57.9 kg, SpO2 98 %.    Intake/Output from previous day: 10/04 0701 - 10/05 0700 In: 567 [IV Piggyback:567] Out: 500 [Urine:500]  Intake/Output this shift: Total I/O In: 1147.1 [P.O.:720; I.V.:327.2; IV Piggyback:99.9] Out: 550 [Urine:550]   General appearance:  Alert, NAD. Oriented x 3. Resp:  Decreased breath sounds, no wheezes. CTA in upper lung fields bilaterally.  Cardio: RRR GI: Distended, fluid wave and shifting dullness present. BS decreased. No rebound. Extremities:  Atrophic no edema.    Lab Results: Results for orders placed or performed during the hospital encounter of 12/22/18 (from the past 24 hour(s))  CBC     Status: Abnormal   Collection Time: 12/24/18  4:30 AM  Result Value Ref Range   WBC 18.8 (H) 4.0 - 10.5 K/uL   RBC 3.46 (L) 4.22 - 5.81 MIL/uL   Hemoglobin 11.6 (L) 13.0 - 17.0 g/dL   HCT 31.9 (L) 39.0 - 52.0 %   MCV 92.2 80.0 - 100.0 fL   MCH 33.5 26.0 - 34.0 pg   MCHC 36.4 (H) 30.0 - 36.0 g/dL   RDW 14.2 11.5 - 15.5 %   Platelets 121 (L) 150 - 400  K/uL   nRBC 0.0 0.0 - 0.2 %  Comprehensive metabolic panel     Status: Abnormal   Collection Time: 12/24/18  4:30 AM  Result Value Ref Range   Sodium 120 (L) 135 - 145 mmol/L   Potassium 6.6 (HH) 3.5 - 5.1 mmol/L   Chloride 93 (L) 98 - 111 mmol/L   CO2 21 (L) 22 - 32 mmol/L   Glucose, Bld 98 70 - 99 mg/dL   BUN 49 (H) 6 - 20 mg/dL   Creatinine, Ser 1.76 (H) 0.61 - 1.24 mg/dL   Calcium 8.4 (L) 8.9 - 10.3 mg/dL   Total Protein 5.2 (L) 6.5 - 8.1 g/dL   Albumin 2.6 (L) 3.5 - 5.0 g/dL   AST 87 (H) 15 - 41 U/L   ALT 55 (H) 0 - 44 U/L   Alkaline Phosphatase 57 38 - 126 U/L   Total Bilirubin 2.5 (H) 0.3 - 1.2 mg/dL   GFR calc non Af Amer 42 (L) >60 mL/min   GFR calc Af Amer 49 (L) >60 mL/min   Anion gap 6 5 - 15  Glucose, capillary     Status: Abnormal   Collection Time: 12/24/18  7:33 AM  Result Value Ref Range   Glucose-Capillary 57 (L) 70 - 99 mg/dL  Ammonia     Status: Abnormal   Collection Time: 12/24/18 11:50 AM  Result Value Ref Range   Ammonia 38 (H) 9 -  35 umol/L  Hemoglobin and hematocrit, blood     Status: Abnormal   Collection Time: 12/24/18 12:47 PM  Result Value Ref Range   Hemoglobin 11.8 (L) 13.0 - 17.0 g/dL   HCT 33.0 (L) 39.0 - 52.0 %  Sodium     Status: Abnormal   Collection Time: 12/24/18  2:58 PM  Result Value Ref Range   Sodium 117 (LL) 135 - 145 mmol/L  Potassium     Status: Abnormal   Collection Time: 12/24/18  2:58 PM  Result Value Ref Range   Potassium 5.5 (H) 3.5 - 5.1 mmol/L     Recent Labs    12/22/18 2029 12/23/18 0545 12/24/18 0430 12/24/18 1247  WBC 10.8* 22.2* 18.8*  --   HGB 14.7 15.0 11.6* 11.8*  HCT 41.1 42.7 31.9* 33.0*  PLT 207 205 121*  --    BMET Recent Labs    12/22/18 2029 12/23/18 0545 12/24/18 0430 12/24/18 1458  NA 124* 123* 120* 117*  K 5.4* 5.6* 6.6* 5.5*  CL 92* 96* 93*  --   CO2 21* 15* 21*  --   GLUCOSE 112* 104* 98  --   BUN 31* 35* 49*  --   CREATININE 1.20 1.45* 1.76*  --   CALCIUM 8.9 8.5* 8.4*  --     LFT Recent Labs    12/22/18 2029  12/24/18 0430  PROT 6.6   < > 5.2*  ALBUMIN 2.8*   < > 2.6*  AST 132*   < > 87*  ALT 78*   < > 55*  ALKPHOS 115   < > 57  BILITOT 3.5*   < > 2.5*  BILIDIR 1.3*  --   --   IBILI 2.2*  --   --    < > = values in this interval not displayed.   PT/INR Recent Labs    12/23/18 0851  LABPROT 17.8*  INR 1.5*   Hepatitis Panel No results for input(s): HEPBSAG, HCVAB, HEPAIGM, HEPBIGM in the last 72 hours. C-Diff No results for input(s): CDIFFTOX in the last 72 hours. No results for input(s): CDIFFPCR in the last 72 hours.   Studies/Results: Ct Abdomen Pelvis W Contrast  Result Date: 12/22/2018 CLINICAL DATA:  58 year old male with history of acute generalized abdominal pain. Coffee ground emesis today. History of liver cancer. Cirrhosis. EXAM: CT ABDOMEN AND PELVIS WITH CONTRAST TECHNIQUE: Multidetector CT imaging of the abdomen and pelvis was performed using the standard protocol following bolus administration of intravenous contrast. CONTRAST:  117mL OMNIPAQUE IOHEXOL 300 MG/ML  SOLN COMPARISON:  CT the abdomen and pelvis 09/25/2018. FINDINGS: Lower chest: Mild linear scarring or subsegmental atelectasis in the right lower lobe. Aortic atherosclerosis. Hepatobiliary: Liver has a shrunken appearance and nodular contour, indicative of advanced cirrhosis. In segment 5 of the liver (axial image 27 of series 2 and coronal image 54 of series 5) there is a new 3.4 x 3.3 x 3.1 cm hypovascular area which corresponds to the recently ablated lesion site (patient underwent thermal ablation of a lesion in this region on 11/21/2018). No other suspicious appearing hepatic lesions are noted. No intra or extrahepatic biliary ductal dilatation. Gallbladder is nearly decompressed and otherwise unremarkable in appearance. Pancreas: No pancreatic mass. No pancreatic ductal dilatation. No pancreatic or peripancreatic fluid collections or inflammatory changes. Spleen:  Unremarkable. Adrenals/Urinary Tract: Bilateral kidneys and adrenal glands are normal in appearance. No hydroureteronephrosis. Urinary bladder is normal in appearance. Stomach/Bowel: Stomach is grossly unremarkable in appearance. No pathologic dilatation  of small bowel or colon. Fatty mural thickening in the cecum, ascending colon and proximal transverse colon, nonspecific. Appendix is not confidently identified. Vascular/Lymphatic: Aortic atherosclerosis, without evidence of aneurysm or dissection in the abdominal or pelvic vasculature. No lymphadenopathy noted in the abdomen or pelvis. Reproductive: Prostate gland and seminal vesicles are unremarkable in appearance. Other: Large volume of ascites. No pneumoperitoneum. Small right inguinal hernia containing ascites. Musculoskeletal: There are no aggressive appearing lytic or blastic lesions noted in the visualized portions of the skeleton. IMPRESSION: 1. Large volume of ascites. 2. Post ablation changes in the right lobe of the liver, as above. 3. Advanced changes of cirrhosis redemonstrated. 4. Aortic atherosclerosis. 5. Additional incidental findings, as above. Electronically Signed   By: Vinnie Langton M.D.   On: 12/22/2018 22:07    Scheduled Inpatient Medications:   . Chlorhexidine Gluconate Cloth  6 each Topical Daily  . escitalopram  10 mg Oral Daily  . mouth rinse  15 mL Mouth Rinse q12n4p  . pantoprazole (PROTONIX) IV  40 mg Intravenous Q12H  . sodium zirconium cyclosilicate  10 g Oral Daily  . sodium zirconium cyclosilicate  5 g Oral Once    Continuous Inpatient Infusions:   . cefTRIAXone (ROCEPHIN)  IV Stopped (12/24/18 1016)  . metronidazole Stopped (12/24/18 1448)    PRN Inpatient Medications:  acetaminophen **OR** acetaminophen, ondansetron **OR** ondansetron (ZOFRAN) IV, traMADol    Assessment:  1. Spontaneous bacterial periotonitis. On IV rocephin.  2. Coffee ground emesis - Hgb dropped to 11.6, but stable.  3. Child  Pugh Class "C" cirrhosis.  4. Hyponatremia - On Fluid restriction per Nephrology.  5. AKI -per Dr. Candiss Norse, nephrology.  6. Ascites - s/p Large volume paracentesis. Diuretics contraindicated at present time due to AKI and Hyponatremia.  7 Hepatocellular carcinoma - secondary to cirrhosis.  Plan:  1. Continue current management.  2. EGD when clinically feasible, possibly as outpatient.  3. Following.  Collin Hendley K. Alice Reichert, M.D. 12/24/2018, 6:18 PM

## 2018-12-24 NOTE — Progress Notes (Signed)
Notified by NT Eli of pt's BP 85/60. MEWS score is a 3 due to BP and HR in the 110s, but patient is in NAD. MD Marcille Blanco made aware and no new orders given at this time. MD to come and assess patient. Nursing staff will continue to monitor for any changes in patient status.  Earleen Reaper, RN

## 2018-12-24 NOTE — Plan of Care (Signed)
Pt's blood pressure running low, but at baseline for pt.  Dr. Margaretmary Eddy aware.  EGD planned for tomorrow.  Goal today is to lower potassium level.   Problem: Education: Goal: Knowledge of General Education information will improve Description: Including pain rating scale, medication(s)/side effects and non-pharmacologic comfort measures Outcome: Progressing   Problem: Health Behavior/Discharge Planning: Goal: Ability to manage health-related needs will improve Outcome: Progressing   Problem: Clinical Measurements: Goal: Ability to maintain clinical measurements within normal limits will improve Outcome: Progressing Goal: Will remain free from infection Outcome: Progressing Goal: Diagnostic test results will improve Outcome: Progressing Goal: Respiratory complications will improve Outcome: Progressing Goal: Cardiovascular complication will be avoided Outcome: Progressing   Problem: Activity: Goal: Risk for activity intolerance will decrease Outcome: Progressing   Problem: Nutrition: Goal: Adequate nutrition will be maintained Outcome: Progressing   Problem: Coping: Goal: Level of anxiety will decrease Outcome: Progressing   Problem: Elimination: Goal: Will not experience complications related to bowel motility Outcome: Progressing Goal: Will not experience complications related to urinary retention Outcome: Progressing   Problem: Pain Managment: Goal: General experience of comfort will improve Outcome: Progressing   Problem: Safety: Goal: Ability to remain free from injury will improve Outcome: Progressing   Problem: Skin Integrity: Goal: Risk for impaired skin integrity will decrease Outcome: Progressing

## 2018-12-24 NOTE — Progress Notes (Signed)
Update provided to pt's sister, Fraser Din.

## 2018-12-24 NOTE — Progress Notes (Signed)
Pt's AM potassium 6.6, up from 5.6 on 10/4. Notified MD Marcille Blanco and MD to place orders. Pt in NAD at this time. Nursing staff will continue to monitor for any changes in patient status.   Earleen Reaper, RN

## 2018-12-24 NOTE — Progress Notes (Signed)
CRITICAL VALUE STICKER  CRITICAL VALUE: Na 117  RECEIVER (on-site recipient of call): Awab Abebe  DATE & TIME NOTIFIED: 12/24/18 at [place] (representative from lab):  MD NOTIFIED: Dr. Margaretmary Eddy   TIME OF NOTIFICATION: 1528h  RESPONSE: [id number]

## 2018-12-24 NOTE — Progress Notes (Signed)
Reviewed with pt at change of shift that he was not allowed to eat or drink anything in preparation for his EGD.  Pt verbalized understanding.  Pt communication board indicated his NPO status.  Writer rounded on pt and found him finishing his breakfast tray.  EGD will be delayed due to pt's non-compliance with NPO status.  MD informed.

## 2018-12-25 ENCOUNTER — Inpatient Hospital Stay: Admission: RE | Admit: 2018-12-25 | Payer: Self-pay | Source: Home / Self Care | Admitting: Internal Medicine

## 2018-12-25 ENCOUNTER — Encounter
Admission: EM | Disposition: A | Discharge status: Hospice | Payer: Self-pay | Source: Home / Self Care | Attending: Internal Medicine

## 2018-12-25 LAB — BASIC METABOLIC PANEL
Anion gap: 4 — ABNORMAL LOW (ref 5–15)
BUN: 45 mg/dL — ABNORMAL HIGH (ref 6–20)
CO2: 21 mmol/L — ABNORMAL LOW (ref 22–32)
Calcium: 8.1 mg/dL — ABNORMAL LOW (ref 8.9–10.3)
Chloride: 93 mmol/L — ABNORMAL LOW (ref 98–111)
Creatinine, Ser: 1.23 mg/dL (ref 0.61–1.24)
GFR calc Af Amer: >60 mL/min (ref >60)
GFR calc non Af Amer: >60 mL/min (ref >60)
Glucose, Bld: 83 mg/dL (ref 70–99)
Potassium: 4.8 mmol/L (ref 3.5–5.1)
Sodium: 118 mmol/L — CL (ref 135–145)

## 2018-12-25 LAB — CBC
HCT: 31.5 % — ABNORMAL LOW (ref 39.0–52.0)
Hemoglobin: 11.5 g/dL — ABNORMAL LOW (ref 13.0–17.0)
MCH: 33.2 pg (ref 26.0–34.0)
MCHC: 36.5 g/dL — ABNORMAL HIGH (ref 30.0–36.0)
MCV: 91 fL (ref 80.0–100.0)
Platelets: 122 10*3/uL — ABNORMAL LOW (ref 150–400)
RBC: 3.46 MIL/uL — ABNORMAL LOW (ref 4.22–5.81)
RDW: 14.2 % (ref 11.5–15.5)
WBC: 16.5 10*3/uL — ABNORMAL HIGH (ref 4.0–10.5)
nRBC: 0 % (ref 0.0–0.2)

## 2018-12-25 LAB — HEMOGLOBIN AND HEMATOCRIT, BLOOD
HCT: 30.4 % — ABNORMAL LOW (ref 39.0–52.0)
Hemoglobin: 11.3 g/dL — ABNORMAL LOW (ref 13.0–17.0)

## 2018-12-25 LAB — SODIUM
Sodium: 121 mmol/L — ABNORMAL LOW (ref 135–145)
Sodium: 121 mmol/L — ABNORMAL LOW (ref 135–145)

## 2018-12-25 LAB — GLUCOSE, CAPILLARY: Glucose-Capillary: 98 mg/dL (ref 70–99)

## 2018-12-25 LAB — CYTOLOGY - NON PAP

## 2018-12-25 SURGERY — ESOPHAGOGASTRODUODENOSCOPY (EGD) WITH PROPOFOL
Anesthesia: Monitor Anesthesia Care

## 2018-12-25 SURGERY — ESOPHAGOGASTRODUODENOSCOPY (EGD) WITH PROPOFOL
Anesthesia: General

## 2018-12-25 MED ORDER — SODIUM CHLORIDE 0.9 % IV SOLN
INTRAVENOUS | Status: DC | PRN
  Administered 2018-12-25 – 2018-12-28 (×3): 250 mL via INTRAVENOUS

## 2018-12-25 NOTE — Plan of Care (Signed)
  Problem: Clinical Measurements: Goal: Ability to maintain clinical measurements within normal limits will improve Outcome: Not Progressing Note: Na 118 K 4.8 EGD cancelled for today    Problem: Education: Goal: Knowledge of General Education information will improve Description: Including pain rating scale, medication(s)/side effects and non-pharmacologic comfort measures Outcome: Progressing   Problem: Health Behavior/Discharge Planning: Goal: Ability to manage health-related needs will improve Outcome: Progressing   Problem: Clinical Measurements: Goal: Will remain free from infection Outcome: Progressing Goal: Diagnostic test results will improve Outcome: Progressing Goal: Respiratory complications will improve Outcome: Progressing Goal: Cardiovascular complication will be avoided Outcome: Progressing   Problem: Activity: Goal: Risk for activity intolerance will decrease Outcome: Progressing   Problem: Nutrition: Goal: Adequate nutrition will be maintained Outcome: Progressing   Problem: Coping: Goal: Level of anxiety will decrease Outcome: Progressing   Problem: Elimination: Goal: Will not experience complications related to bowel motility Outcome: Progressing Goal: Will not experience complications related to urinary retention Outcome: Progressing   Problem: Pain Managment: Goal: General experience of comfort will improve Outcome: Progressing   Problem: Safety: Goal: Ability to remain free from injury will improve Outcome: Progressing   Problem: Skin Integrity: Goal: Risk for impaired skin integrity will decrease Outcome: Progressing

## 2018-12-25 NOTE — TOC Initial Note (Addendum)
Transition of Care [facility]) - Initial/Assessment Note    Patient Details  Name: David Brown MRN: [id number] Date of Birth: 1960-04-14  Transition of Care [facility]) CM/SW Contact:    Elza Rafter, RN Phone Number: 12/25/2018, 12:01 PM  Clinical Narrative:     Patient is from home with brother.   Admitted with worsening abdominal swelling. Hx of cirrhosis with ascites.  Pending EGD; patient ate after  MN yesterday and EGD was postponed.  Today K+ 4.8 and Na 118-EGD postponed again.  Patient does not have insurance.  He has applied for Medicaid.  Uses [facility] for PCP and medications-they assist him financially. Sister drives him to appointment.  Would benefit from palliative consult.  He states he does not have any DME at home but could use a walker.  Will refer to [facility] with [employer].  Will also obtain PT eval prior to DC.        Asked MD for palliative consult.               Patient Goals and CMS Choice        Expected Discharge Plan and Services                                                Prior Living Arrangements/Services                       Activities of Daily Living Home Assistive Devices/Equipment: None ADL Screening (condition at time of admission) Patient's cognitive ability adequate to safely complete daily activities?: Yes Is the patient deaf or have difficulty hearing?: No Does the patient have difficulty seeing, even when wearing glasses/contacts?: No Does the patient have difficulty concentrating, remembering, or making decisions?: No Patient able to express need for assistance with ADLs?: No Does the patient have difficulty dressing or bathing?: No Independently performs ADLs?: No Communication: Independent Dressing (OT): Independent Grooming: Independent Feeding: Independent Bathing: Independent Toileting: Independent In/Out Bed: Independent Walks in Home: Independent Does the patient have difficulty walking or climbing  stairs?: No Weakness of Legs: None Weakness of Arms/Hands: None  Permission Sought/Granted                  Emotional Assessment              Admission diagnosis:  Generalized abdominal pain [R10.84] Cirrhosis of liver with ascites, unspecified hepatic cirrhosis type ([place]) [K74.60, R18.8] Cirrhosis of liver with ascites ([place]) [K74.60, R18.8] Patient Active Problem List   Diagnosis Date Noted  . Palliative care encounter   . Hyponatremia 11/07/2018  . Hepatocellular carcinoma ([place]) 10/14/2018  . Ascites 09/26/2018  . Cirrhosis of liver with ascites ([place])    PCP:  Patient, No Pcp Per Pharmacy:   [facility] [place], [address] [place] [address] Phone: [phone] Fax: [phone]  [place], [place] [address] [place] [address] Phone: [phone] Fax: [phone]     Social Determinants of Health (SDOH) Interventions    Readmission Risk Interventions Readmission Risk Prevention Plan 12/25/2018  Transportation Screening Complete  Medication Review ([place]) Complete  HRI or Home Care Consult Complete  SW Recovery Care/Counseling Consult Complete  Some recent data might be hidden

## 2018-12-25 NOTE — Progress Notes (Signed)
[facility] [place] NAME: David Brown    MR#:  [id number]  DATE OF BIRTH:  [birth year]  SUBJECTIVE:  CHIEF COMPLAINT:   Patient is feeling  tired  denies any hematemesis ,  EGD canceled because of the electrolyte abnormalities  REVIEW OF SYSTEMS:  CONSTITUTIONAL: No fever, fatigue or weakness.  EYES: No blurred or double vision.  EARS, NOSE, AND THROAT: No tinnitus or ear pain.  RESPIRATORY: No cough, shortness of breath, wheezing or hemoptysis.  CARDIOVASCULAR: No chest pain, orthopnea, edema.  GASTROINTESTINAL: No nausea, had coffee-ground vomiting, denies diarrhea or abdominal pain.  Has abdominal distention from cirrhosis GENITOURINARY: No dysuria, hematuria.  ENDOCRINE: No polyuria, nocturia,  HEMATOLOGY: No anemia, easy bruising or bleeding SKIN: No rash or lesion. MUSCULOSKELETAL: No joint pain or arthritis.   NEUROLOGIC: No tingling, numbness, weakness.  PSYCHIATRY: No anxiety or depression.   DRUG ALLERGIES:  No Known Allergies  VITALS:  Blood pressure (!) 85/63, pulse (!) 101, temperature 98.6 F (37 C), resp. rate 19, height 5\' 6"  (1.676 m), weight 59.5 kg, SpO2 99 %.  PHYSICAL EXAMINATION:  GENERAL:  58 y.o.-year-old patient lying in the bed with no acute distress.  Emaciated EYES: Pupils equal, round, reactive to light and accommodation. No scleral icterus. Extraocular muscles intact.  HEENT: Head atraumatic, normocephalic. Oropharynx and nasopharynx clear.  NECK:  Supple, no jugular venous distention. No thyroid enlargement, no tenderness.  LUNGS: Normal breath sounds bilaterally, no wheezing, rales,rhonchi or crepitation. No use of accessory muscles of respiration.  CARDIOVASCULAR: S1, S2 normal. No murmurs, rubs, or gallops.  ABDOMEN: Soft, nontender, distended with ascites. Bowel sounds present.  EXTREMITIES: No pedal edema, cyanosis, or clubbing.  NEUROLOGIC: Cranial nerves II through XII are intact.  Sensation intact. Gait not checked.  PSYCHIATRIC: The patient is alert and oriented x 3.  SKIN: No obvious rash, lesion, or ulcer.    LABORATORY PANEL:   CBC Recent Labs  Lab 12/25/18 0254  WBC 16.5*  HGB 11.5*  HCT 31.5*  PLT 122*   ------------------------------------------------------------------------------------------------------------------  Chemistries  Recent Labs  Lab 12/24/18 0430  12/25/18 0254  NA 120*   < > 118*  K 6.6*   < > 4.8  CL 93*  --  93*  CO2 21*  --  21*  GLUCOSE 98  --  83  BUN 49*  --  45*  CREATININE 1.76*  --  1.23  CALCIUM 8.4*  --  8.1*  AST 87*  --   --   ALT 55*  --   --   ALKPHOS 57  --   --   BILITOT 2.5*  --   --    < > = values in this interval not displayed.   ------------------------------------------------------------------------------------------------------------------  Cardiac Enzymes No results for input(s): TROPONINI in the last 168 hours. ------------------------------------------------------------------------------------------------------------------  RADIOLOGY:  No results found.  EKG:   Orders placed or performed during the hospital encounter of 12/22/18  . ED EKG  . ED EKG  . EKG 12-Lead  . EKG 12-Lead  . EKG 12-Lead  . EKG 12-Lead    ASSESSMENT AND PLAN:  #Hyponatremia-secondary to volume overload and underlying hepatocellular carcinoma Sodium at 123 -120-118 IV fluids discontinued, monitor patient on fluid restriction at 1200 mL/day Nephrology Dr. Candiss Norse is following appreciate the recommendations  #Coffee-ground emesis Hemodynamically patient is stable Hemoglobin trended down from 15-11.6-11.5 Seen by gastroenterology Dr. Alice Reichert , EGD got canceled because of electrolyte abnormalities , will  reconsult GI patient is medically stable  monitor hemoglobin and transfuse as needed.  No active bleeding noticed  #Hyperkalemia Twelve-lead EKG-no peak T waves noticed otherwise normal EKG Resolved with  Lokelma  #Liver cirrhosis end-stage with ascites-recurrent from alcoholism Status post paracentesis on 6 L extracted by intensivist Outpatient follow-up with primary gastroenterology Dr. [name] Child Pugh Class "C" cirrhosis with 10 points  #Acute kidney injury-from IV contrast exposure on October 3 Hydrated with IV fluids gently and avoid nephrotoxins Creatinine is improving 1.76-1.2 Nephrology is following  #Hepatocellular carcinoma status post ablation follow-up with  hepatologist as an outpatient including potential transplantation of the liver Follow-up with primary oncologist Dr. Grayland Ormond after discharge Patient is not sure about palliative care consult  #Failure to thrive Palliative care consult  Plan discussed with  RN  All the records are reviewed and case discussed with Care Management/Social Workerr. Management plans discussed with the patient CODE STATUS: FC   TOTAL TIME TAKING CARE OF THIS PATIENT: 35  minutes.   POSSIBLE D/C IN 2DAYS, DEPENDING ON CLINICAL CONDITION.  Note: This dictation was prepared with Dragon dictation along with smaller phrase technology. Any transcriptional errors that result from this process are unintentional.   David Brown M.D on 12/25/2018 at 1:56 PM  Between 7am to 6pm - Pager - [phone] After 6pm go to [email or web address] - password [email or web address] [facility]  Office  [phone]  CC: Primary care physician; Patient, No Pcp Per

## 2018-12-25 NOTE — Progress Notes (Signed)
GI Inpatient Follow-up Note  Subjective:  Patient seen in f/u for coffee-ground emesis. EGD was planned for today, but was cancelled due to hyponatremia and hyperkalemia. He is currently on IV antibiotics for SBP. There have been signs of recurrent GI bleeding in the form of hematemesis, hematochezia, or melena. He feels well today with no GI complaints. He has been able to eat without any nausea or vomiting. No acute events overnight.   Scheduled Inpatient Medications:  . Chlorhexidine Gluconate Cloth  6 each Topical Daily  . escitalopram  10 mg Oral Daily  . mouth rinse  15 mL Mouth Rinse q12n4p  . pantoprazole (PROTONIX) IV  40 mg Intravenous Q12H  . sodium zirconium cyclosilicate  10 g Oral Daily    Continuous Inpatient Infusions:   . cefTRIAXone (ROCEPHIN)  IV 2 g (12/25/18 1032)  . metronidazole 500 mg (12/25/18 0431)    PRN Inpatient Medications:  acetaminophen **OR** acetaminophen, ondansetron **OR** ondansetron (ZOFRAN) IV, traMADol  Review of Systems: Constitutional: Weight is stable.  Eyes: No changes in vision. ENT: No oral lesions, sore throat.  GI: see HPI.  Heme/Lymph: No easy bruising.  CV: No chest pain.  GU: No hematuria.  Integumentary: No rashes.  Neuro: No headaches.  Psych: No depression/anxiety.  Endocrine: No heat/cold intolerance.  Allergic/Immunologic: No urticaria.  Resp: No cough, SOB.  Musculoskeletal: No joint swelling.    Physical Examination: BP (!) 85/63 (BP Location: Left Arm)   Pulse (!) 101   Temp 98.6 F (37 C)   Resp 19   Ht 5\' 6"  (1.676 m)   Wt 59.5 kg   SpO2 99%   BMI 21.18 kg/m   Frail male laying in hospital bed, no accessory muscle use. Gen: NAD, alert and oriented x 4 HEENT: PEERLA, EOMI, Neck: supple, no JVD or thyromegaly Chest: CTA bilaterally, no wheezes, crackles, or other adventitious sounds CV: RRR, no m/g/c/r Abd: soft, mildly distended, +BS in all four quadrants; generalized tenderness to palpation, no HSM,  guarding, ridigity, or rebound tenderness Ext: no edema, well perfused with 2+ pulses, Skin: no rash or lesions noted Lymph: no LAD  Data: Lab Results  Component Value Date   WBC 16.5 (H) 12/25/2018   HGB 11.5 (L) 12/25/2018   HCT 31.5 (L) 12/25/2018   MCV 91.0 12/25/2018   PLT 122 (L) 12/25/2018   Recent Labs  Lab 12/24/18 1832 12/25/18 0033 12/25/18 0254  HGB 12.2* 11.3* 11.5*   Lab Results  Component Value Date   NA 118 (LL) 12/25/2018   K 4.8 12/25/2018   CL 93 (L) 12/25/2018   CO2 21 (L) 12/25/2018   BUN 45 (H) 12/25/2018   CREATININE 1.23 12/25/2018   Lab Results  Component Value Date   ALT 55 (H) 12/24/2018   AST 87 (H) 12/24/2018   ALKPHOS 57 12/24/2018   BILITOT 2.5 (H) 12/24/2018   Recent Labs  Lab 12/23/18 0851  INR 1.5*   Assessment:  1. Spontaneous bacterial periotonitis. On IV rocephin.  2. Coffee ground emesis - Hgb dropped to 11.3, but stable. No signs of recurrent bleeding.  3. Child Pugh Class "C" cirrhosis.  4. Hyponatremia - On Fluid restriction per Nephrology.  5. AKI -per Dr. Candiss Norse, nephrology.  6. Ascites - s/p Large volume paracentesis. Diuretics contraindicated at present time due to AKI and Hyponatremia.  7 Hepatocellular carcinoma - secondary to cirrhosis.  Plan:  1. Continue current management.  2. EGD when clinically feasible, possibly as outpatient.  3. No signs  of recurrent GI bleeding. GI will sign off at this time. Please call back if there any questions or concerns.  4. He should follow as outpatient with Dr. Barbaraann Cao, [facility] [phone] (Cell)

## 2018-12-25 NOTE — Progress Notes (Signed)
[facility], [place] 12/25/18  Subjective:   Patient feels well this morning Able to eat without any nausea or vomiting Urine output at 550 cc last 24 hours More urine in the urinal this morning Serum sodium had decreased to 117.  Up to 118 this morning Potassium is now in the normal range Serum creatinine lower at 1.23  Objective:  Vital signs in last 24 hours:  Temp:  [98.2 F (36.8 C)-98.8 F (37.1 C)] 98.6 F (37 C) (10/06 0724) Pulse Rate:  [101-107] 101 (10/06 0724) Resp:  [19-20] 19 (10/06 0724) BP: (85-97)/(63-71) 85/63 (10/06 0724) SpO2:  [98 %-100 %] 99 % (10/06 0724) Weight:  [59.5 kg] 59.5 kg (10/06 0344)  Weight change: 2.177 kg Filed Weights   12/23/18 1547 12/24/18 0025 12/25/18 0344  Weight: 57.3 kg 57.9 kg 59.5 kg    Intake/Output:    Intake/Output Summary (Last 24 hours) at 12/25/2018 1037 Last data filed at 12/25/2018 0932 Gross per 24 hour  Intake 1267.12 ml  Output 550 ml  Net 717.12 ml     Physical Exam: General:  Laying in the bed, no acute distress, thin cachectic  HEENT  icterus, moist oral mucous membranes  Pulm/lungs  clear to auscultation bilaterally, room air  CVS/Heart  regular rhythm  Abdomen:   Distended, mild tenderness left upper quadrant  Extremities:  No peripheral edema  Neurologic:  Alert, oriented  Skin:  Warm, dry    Basic Metabolic Panel:  Recent Labs  Lab 12/18/18 1126 12/22/18 2029 12/23/18 0545 12/24/18 0430 12/24/18 1458 12/24/18 2110 12/25/18 0254  NA 128* 124* 123* 120* 117* 117* 118*  K 4.0 5.4* 5.6* 6.6* 5.5* 5.1 4.8  CL 98 92* 96* 93*  --   --  93*  CO2 22 21* 15* 21*  --   --  21*  GLUCOSE 143* 112* 104* 98  --   --  83  BUN 22* 31* 35* 49*  --   --  45*  CREATININE 0.77 1.20 1.45* 1.76*  --   --  1.23  CALCIUM 8.3* 8.9 8.5* 8.4*  --   --  8.1*     CBC: Recent Labs  Lab 12/18/18 1126 12/22/18 2029 12/23/18 0545 12/24/18 0430 12/24/18 1247 12/24/18 1832  12/25/18 0033 12/25/18 0254  WBC 7.7 10.8* 22.2* 18.8*  --   --   --  16.5*  NEUTROABS  --  9.0*  --   --   --   --   --   --   HGB 12.3* 14.7 15.0 11.6* 11.8* 12.2* 11.3* 11.5*  HCT 35.0* 41.1 42.7 31.9* 33.0* 33.6* 30.4* 31.5*  MCV 95.9 93.6 94.9 92.2  --   --   --  91.0  PLT 148* 207 205 121*  --   --   --  122*      Lab Results  Component Value Date   HEPBSAG Negative 09/26/2018   HEPBIGM Negative 09/26/2018      Microbiology:  Recent Results (from the past 240 hour(s))  SARS CORONAVIRUS 2 (TAT 6-24 HRS) Nasopharyngeal Nasopharyngeal Swab     Status: None   Collection Time: 12/22/18 10:25 PM   Specimen: Nasopharyngeal Swab  Result Value Ref Range Status   SARS Coronavirus 2 NEGATIVE NEGATIVE Final    Comment: (NOTE) SARS-CoV-2 target nucleic acids are NOT DETECTED. The SARS-CoV-2 RNA is generally detectable in upper and lower respiratory specimens during the acute phase of infection. Negative results do not preclude SARS-CoV-2  infection, do not rule out co-infections with other pathogens, and should not be used as the sole basis for treatment or other patient management decisions. Negative results must be combined with clinical observations, patient history, and epidemiological information. The expected result is Negative. Fact Sheet for Patients: SugarRoll.be Fact Sheet for Healthcare Providers: https://www.woods-mathews.com/ This test is not yet approved or cleared by the [place] FDA and  has been authorized for detection and/or diagnosis of SARS-CoV-2 by FDA under an Emergency Use Authorization (EUA). This EUA will remain  in effect (meaning this test can be used) for the duration of the COVID-19 declaration under Section 56 4(b)(1) of the Act, 21 U.S.C. section 360bbb-3(b)(1), unless the authorization is terminated or revoked sooner. Performed at [facility], [place] [address], [place], [place] [address]    MRSA PCR Screening     Status: None   Collection Time: 12/23/18  2:32 AM   Specimen: Nasal Mucosa; Nasopharyngeal  Result Value Ref Range Status   MRSA by PCR NEGATIVE NEGATIVE Final    Comment:        The GeneXpert MRSA Assay (FDA approved for NASAL specimens only), is one component of a comprehensive MRSA colonization surveillance program. It is not intended to diagnose MRSA infection nor to guide or monitor treatment for MRSA infections. Performed at [facility], [address] [place], [place] [address]   SARS Coronavirus 2 [facility] order, Performed in [facility] hospital lab) Nasopharyngeal Nasopharyngeal Swab     Status: None   Collection Time: 12/23/18  8:12 AM   Specimen: Nasopharyngeal Swab  Result Value Ref Range Status   SARS Coronavirus 2 NEGATIVE NEGATIVE Final    Comment: (NOTE) If result is NEGATIVE SARS-CoV-2 target nucleic acids are NOT DETECTED. The SARS-CoV-2 RNA is generally detectable in upper and lower  respiratory specimens during the acute phase of infection. The lowest  concentration of SARS-CoV-2 viral copies this assay can detect is 250  copies / mL. A negative result does not preclude SARS-CoV-2 infection  and should not be used as the sole basis for treatment or other  patient management decisions.  A negative result may occur with  improper specimen collection / handling, submission of specimen other  than nasopharyngeal swab, presence of viral mutation(s) within the  areas targeted by this assay, and inadequate number of viral copies  (<250 copies / mL). A negative result must be combined with clinical  observations, patient history, and epidemiological information. If result is POSITIVE SARS-CoV-2 target nucleic acids are DETECTED. The SARS-CoV-2 RNA is generally detectable in upper and lower  respiratory specimens dur ing the acute phase of infection.  Positive  results are indicative of active infection with SARS-CoV-2.   Clinical  correlation with patient history and other diagnostic information is  necessary to determine patient infection status.  Positive results do  not rule out bacterial infection or co-infection with other viruses. If result is PRESUMPTIVE POSTIVE SARS-CoV-2 nucleic acids MAY BE PRESENT.   A presumptive positive result was obtained on the submitted specimen  and confirmed on repeat testing.  While 2019 novel coronavirus  (SARS-CoV-2) nucleic acids may be present in the submitted sample  additional confirmatory testing may be necessary for epidemiological  and / or clinical management purposes  to differentiate between  SARS-CoV-2 and other Sarbecovirus currently known to infect humans.  If clinically indicated additional testing with an alternate test  methodology [phone]) is advised. The SARS-CoV-2 RNA is generally  detectable in upper  and lower respiratory sp ecimens during the acute  phase of infection. The expected result is Negative. Fact Sheet for Patients:  StrictlyIdeas.no Fact Sheet for Healthcare Providers: BankingDealers.co.za This test is not yet approved or cleared by the [place] FDA and has been authorized for detection and/or diagnosis of SARS-CoV-2 by FDA under an Emergency Use Authorization (EUA).  This EUA will remain in effect (meaning this test can be used) for the duration of the COVID-19 declaration under Section 564(b)(1) of the Act, 21 U.S.C. section 360bbb-3(b)(1), unless the authorization is terminated or revoked sooner. Performed at [facility], [address] [place], [place] [address]   Body fluid culture     Status: None (Preliminary result)   Collection Time: 12/23/18 12:29 PM   Specimen: Abdomen; Body Fluid  Result Value Ref Range Status   Specimen Description   Final    ABDOMEN Performed at [facility], [address] [place], [place] [address]    Special Requests    Final    NONE Performed at [facility], [address] [place], [place] [address]    Gram Stain   Final    MODERATE WBC PRESENT, PREDOMINANTLY PMN NO ORGANISMS SEEN    Culture   Final    NO GROWTH < 24 HOURS Performed at [facility] [address] [place], [place] [address]    Report Status PENDING  Incomplete    Coagulation Studies: Recent Labs    12/23/18 0851  LABPROT 17.8*  INR 1.5*    Urinalysis: No results for input(s): COLORURINE, LABSPEC, PHURINE, GLUCOSEU, HGBUR, BILIRUBINUR, KETONESUR, PROTEINUR, UROBILINOGEN, NITRITE, LEUKOCYTESUR in the last 72 hours.  Invalid input(s): APPERANCEUR    Imaging: No results found.   Medications:   . cefTRIAXone (ROCEPHIN)  IV 2 g (12/25/18 1032)  . metronidazole 500 mg (12/25/18 0431)   . Chlorhexidine Gluconate Cloth  6 each Topical Daily  . escitalopram  10 mg Oral Daily  . mouth rinse  15 mL Mouth Rinse q12n4p  . pantoprazole (PROTONIX) IV  40 mg Intravenous Q12H  . sodium zirconium cyclosilicate  10 g Oral Daily   acetaminophen **OR** acetaminophen, ondansetron **OR** ondansetron (ZOFRAN) IV, traMADol  Assessment/ Plan:  58 y.o. Caucasian male with medical problems of hepatocellular carcinoma and alcoholic cirrhosis, TIA, who was admitted to [facility] on 12/22/2018 for evaluation of worsening abdominal swelling and pain for the past 2 weeks prior to arrival.   1.  Acute kidney injury, IV contrast exposure October 3 Acute kidney injury multifactorial likely secondary to IV contrast exposure and large volume paracentesis causing hemodynamic instability.    On 9/29 patient underwent paracentesis of 10.6 L.  No albumin was given. - supportive care -Serum creatinine improved to 1.3  2.  Severe hyperkalemia. -Agree with shifting measures and Lokelma -Follow low potassium diet -Agree with discontinuing spironolactone  3.  Hyponatremia - Hyponatremia may be related to liver cirrhosis and worsening  Renal function -Consider free water restriction to less than 1.2 L/day  4.  alcoholic cirrhosis -Patient reports he has quit drinking for the past 5 months  5.  Hepatocellular carcinoma status post ablation November 21, 2018   LOS: [place] 10/6/202010:37 AM  [place], [place]  Note: This note was prepared with Dragon dictation. Any transcription errors are unintentional

## 2018-12-26 ENCOUNTER — Other Ambulatory Visit: Payer: Self-pay | Admitting: Hospice and Palliative Medicine

## 2018-12-26 ENCOUNTER — Telehealth: Payer: Self-pay | Admitting: *Deleted

## 2018-12-26 DIAGNOSIS — Z515 Encounter for palliative care: Secondary | ICD-10-CM

## 2018-12-26 DIAGNOSIS — E875 Hyperkalemia: Secondary | ICD-10-CM

## 2018-12-26 DIAGNOSIS — C220 Liver cell carcinoma: Secondary | ICD-10-CM

## 2018-12-26 DIAGNOSIS — K7460 Unspecified cirrhosis of liver: Secondary | ICD-10-CM

## 2018-12-26 DIAGNOSIS — K920 Hematemesis: Secondary | ICD-10-CM

## 2018-12-26 DIAGNOSIS — R188 Other ascites: Secondary | ICD-10-CM

## 2018-12-26 DIAGNOSIS — F17210 Nicotine dependence, cigarettes, uncomplicated: Secondary | ICD-10-CM

## 2018-12-26 DIAGNOSIS — E871 Hypo-osmolality and hyponatremia: Secondary | ICD-10-CM

## 2018-12-26 LAB — BASIC METABOLIC PANEL
Anion gap: 5 (ref 5–15)
BUN: 41 mg/dL — ABNORMAL HIGH (ref 6–20)
CO2: 22 mmol/L (ref 22–32)
Calcium: 8.1 mg/dL — ABNORMAL LOW (ref 8.9–10.3)
Chloride: 95 mmol/L — ABNORMAL LOW (ref 98–111)
Creatinine, Ser: 1.08 mg/dL (ref 0.61–1.24)
GFR calc Af Amer: >60 mL/min (ref >60)
GFR calc non Af Amer: >60 mL/min (ref >60)
Glucose, Bld: 107 mg/dL — ABNORMAL HIGH (ref 70–99)
Potassium: 4.4 mmol/L (ref 3.5–5.1)
Sodium: 122 mmol/L — ABNORMAL LOW (ref 135–145)

## 2018-12-26 LAB — CBC
HCT: 33.1 % — ABNORMAL LOW (ref 39.0–52.0)
Hemoglobin: 12.2 g/dL — ABNORMAL LOW (ref 13.0–17.0)
MCH: 33.4 pg (ref 26.0–34.0)
MCHC: 36.9 g/dL — ABNORMAL HIGH (ref 30.0–36.0)
MCV: 90.7 fL (ref 80.0–100.0)
Platelets: 132 10*3/uL — ABNORMAL LOW (ref 150–400)
RBC: 3.65 MIL/uL — ABNORMAL LOW (ref 4.22–5.81)
RDW: 14 % (ref 11.5–15.5)
WBC: 14 10*3/uL — ABNORMAL HIGH (ref 4.0–10.5)
nRBC: 0 % (ref 0.0–0.2)

## 2018-12-26 LAB — SODIUM
Sodium: 123 mmol/L — ABNORMAL LOW (ref 135–145)
Sodium: 122 mmol/L — ABNORMAL LOW (ref 135–145)
Sodium: 119 mmol/L — CL (ref 135–145)

## 2018-12-26 NOTE — Progress Notes (Signed)
[facility] NAME: David Brown    MR#:  [id number]  DATE OF BIRTH:  04/17/1960  SUBJECTIVE:  CHIEF COMPLAINT:   Patient is feeling better but tired   EGD canceled because of the electrolyte abnormalities  REVIEW OF SYSTEMS:  CONSTITUTIONAL: No fever, fatigue or weakness.  EYES: No blurred or double vision.  EARS, NOSE, AND THROAT: No tinnitus or ear pain.  RESPIRATORY: No cough, shortness of breath, wheezing or hemoptysis.  CARDIOVASCULAR: No chest pain, orthopnea, edema.  GASTROINTESTINAL: No nausea, had coffee-ground vomiting, denies diarrhea or abdominal pain.  Has abdominal distention from cirrhosis GENITOURINARY: No dysuria, hematuria.  ENDOCRINE: No polyuria, nocturia,  HEMATOLOGY: No anemia, easy bruising or bleeding SKIN: No rash or lesion. MUSCULOSKELETAL: No joint pain or arthritis.   NEUROLOGIC: No tingling, numbness, weakness.  PSYCHIATRY: No anxiety or depression.   DRUG ALLERGIES:  No Known Allergies  VITALS:  Blood pressure 108/77, pulse 100, temperature 98.4 F (36.9 C), temperature source Oral, resp. rate 20, height 5\' 6"  (1.676 m), weight 60.2 kg, SpO2 99 %.  PHYSICAL EXAMINATION:  GENERAL:  58 y.o.-year-old patient lying in the bed with no acute distress.  Emaciated EYES: Pupils equal, round, reactive to light and accommodation. No scleral icterus. Extraocular muscles intact.  HEENT: Head atraumatic, normocephalic. Oropharynx and nasopharynx clear.  NECK:  Supple, no jugular venous distention. No thyroid enlargement, no tenderness.  LUNGS: Normal breath sounds bilaterally, no wheezing, rales,rhonchi or crepitation. No use of accessory muscles of respiration.  CARDIOVASCULAR: S1, S2 normal. No murmurs, rubs, or gallops.  ABDOMEN: Soft, nontender, distended with ascites. Bowel sounds present.  EXTREMITIES: No pedal edema, cyanosis, or clubbing.  NEUROLOGIC: Cranial nerves II through XII are  intact. Sensation intact. Gait not checked.  PSYCHIATRIC: The patient is alert and oriented x 3.  SKIN: No obvious rash, lesion, or ulcer.    LABORATORY PANEL:   CBC Recent Labs  Lab 12/26/18 0239  WBC 14.0*  HGB 12.2*  HCT 33.1*  PLT 132*   ------------------------------------------------------------------------------------------------------------------  Chemistries  Recent Labs  Lab 12/24/18 0430  12/26/18 0239 12/26/18 0813  NA 120*   < > 122* 123*  K 6.6*   < > 4.4  --   CL 93*   < > 95*  --   CO2 21*   < > 22  --   GLUCOSE 98   < > 107*  --   BUN 49*   < > 41*  --   CREATININE 1.76*   < > 1.08  --   CALCIUM 8.4*   < > 8.1*  --   AST 87*  --   --   --   ALT 55*  --   --   --   ALKPHOS 57  --   --   --   BILITOT 2.5*  --   --   --    < > = values in this interval not displayed.   ------------------------------------------------------------------------------------------------------------------  Cardiac Enzymes No results for input(s): TROPONINI in the last 168 hours. ------------------------------------------------------------------------------------------------------------------  RADIOLOGY:  No results found.  EKG:   Orders placed or performed during the hospital encounter of 12/22/18  . ED EKG  . ED EKG  . EKG 12-Lead  . EKG 12-Lead  . EKG 12-Lead  . EKG 12-Lead    ASSESSMENT AND PLAN:  #Hyponatremia-secondary to volume overload and underlying hepatocellular carcinoma Sodium at 123 -120-118-121-122 IV fluids discontinued, monitor patient on  fluid restriction at 1200 mL/day Nephrology Dr. Candiss Norse is following appreciate the recommendations  #Coffee-ground emesis Hemodynamically patient is stable Hemoglobin trended down from 15-11.6-11.5- 12.2  Seen by gastroenterology Dr. Alice Reichert , EGD got canceled because of electrolyte abnormalities , will reconsult GI patient is medically stable  monitor hemoglobin and transfuse as needed.  No active bleeding  noticed   #Hyperkalemia Twelve-lead EKG-no peak T waves noticed otherwise normal EKG Resolved with Lokelma, discontinued Lokelma  #Liver cirrhosis end-stage with ascites-recurrent from alcoholism Status post paracentesis, 6 L extracted by intensivist Outpatient follow-up with primary gastroenterology Dr. [name] Child Pugh Class "C" cirrhosis with 10 points  #Acute kidney injury-from IV contrast exposure on October 3 Hydrated with IV fluids gently and avoid nephrotoxins Creatinine is improving 1.76-1.2-1.08 Nephrology is following  #Hepatocellular carcinoma status post ablation follow-up with  hepatologist as an outpatient including potential transplantation of the liver Follow-up with primary oncologist Dr. Grayland Ormond after discharge Patient is not sure about palliative care consult  #Failure to thrive Palliative care consulted  #Generalized weakness-PT eval  Plan discussed with  RN  All the records are reviewed and case discussed with Care Management/Social Workerr. Management plans discussed with the patient CODE STATUS: FC   TOTAL TIME TAKING CARE OF THIS PATIENT: 35  minutes.   POSSIBLE D/C IN 2DAYS, DEPENDING ON CLINICAL CONDITION.  Note: This dictation was prepared with Dragon dictation along with smaller phrase technology. Any transcriptional errors that result from this process are unintentional.   [name] M.D on 12/26/2018 at 1:03 PM  Between 7am to 6pm - Pager - [phone] After 6pm go to [email or web address] - password [email or web address] [facility]  Office  [phone]  CC: Primary care physician; Patient, No Pcp Per

## 2018-12-26 NOTE — Progress Notes (Signed)
Referral to home-based palliative care to follow after discharge.

## 2018-12-26 NOTE — Telephone Encounter (Signed)
i'm guessing outpatient PC?  Yes, I think it will help him stay out of the hospital.

## 2018-12-26 NOTE — Telephone Encounter (Signed)
Referral for Palliative care received for this patient in hospital who is going to be going to [facility] asking if Dr Grayland Ormond thinks this referral is necessary. Please advise

## 2018-12-26 NOTE — Plan of Care (Signed)
  Problem: Clinical Measurements: Goal: Ability to maintain clinical measurements within normal limits will improve Outcome: Progressing Goal: Respiratory complications will improve Outcome: Progressing Goal: Cardiovascular complication will be avoided Outcome: Progressing   Problem: Pain Managment: Goal: General experience of comfort will improve Outcome: Progressing

## 2018-12-26 NOTE — Progress Notes (Signed)
New referral for [facility] outpatient Palliative to follow at home received from [place] made aware. Patient information given to referral. Flo Shanks BSN, RN, [place] [phone]

## 2018-12-26 NOTE — Plan of Care (Signed)
  Problem: Education: Goal: Knowledge of General Education information will improve Description: Including pain rating scale, medication(s)/side effects and non-pharmacologic comfort measures Outcome: Progressing   Problem: Health Behavior/Discharge Planning: Goal: Ability to manage health-related needs will improve Outcome: Progressing Note: Palliative care and physical therapy following patient to assist as able. Patient possibly for an EGD by the end of the week. Will continue to monitor overall progression. Wenda Low [facility]

## 2018-12-26 NOTE — Progress Notes (Signed)
[facility], [place] 12/26/18  Subjective:   Patient feels well this morning Able to eat without any nausea or vomiting Urine output at 500 cc last 24 hours   Serum sodium upto 123  Potassium is now in the normal range Serum creatinine improved to 1.08  Objective:  Vital signs in last 24 hours:  Temp:  [98.3 F (36.8 C)-98.7 F (37.1 C)] 98.4 F (36.9 C) (10/07 0727) Pulse Rate:  [100-108] 100 (10/07 0727) Resp:  [19-20] 20 (10/07 0727) BP: (98-108)/(64-77) 108/77 (10/07 0727) SpO2:  [99 %-100 %] 99 % (10/07 0727) Weight:  [60.2 kg] 60.2 kg (10/07 0356)  Weight change: 0.68 kg Filed Weights   12/24/18 0025 12/25/18 0344 12/26/18 0356  Weight: 57.9 kg 59.5 kg 60.2 kg    Intake/Output:    Intake/Output Summary (Last 24 hours) at 12/26/2018 1146 Last data filed at 12/26/2018 1025 Gross per 24 hour  Intake 991.72 ml  Output 0 ml  Net 991.72 ml     Physical Exam: General:  Laying in the bed, no acute distress, thin cachectic  HEENT Temporal wasting,  icterus, moist oral mucous membranes  Pulm/lungs  clear to auscultation bilaterally, room air  CVS/Heart  regular rhythm  Abdomen:   Distended, mild tenderness left upper quadrant  Extremities:  No peripheral edema  Neurologic:  Alert, oriented  Skin:  Warm, dry    Basic Metabolic Panel:  Recent Labs  Lab 12/22/18 2029 12/23/18 0545 12/24/18 0430 12/24/18 1458 12/24/18 2110 12/25/18 0254 12/25/18 1427 12/25/18 2015 12/26/18 0239 12/26/18 0813  NA 124* 123* 120* 117* 117* 118* 121* 121* 122* 123*  K 5.4* 5.6* 6.6* 5.5* 5.1 4.8  --   --  4.4  --   CL 92* 96* 93*  --   --  93*  --   --  95*  --   CO2 21* 15* 21*  --   --  21*  --   --  22  --   GLUCOSE 112* 104* 98  --   --  83  --   --  107*  --   BUN 31* 35* 49*  --   --  45*  --   --  41*  --   CREATININE 1.20 1.45* 1.76*  --   --  1.23  --   --  1.08  --   CALCIUM 8.9 8.5* 8.4*  --   --  8.1*  --   --  8.1*  --       CBC: Recent Labs  Lab 12/22/18 2029 12/23/18 0545 12/24/18 0430 12/24/18 1247 12/24/18 1832 12/25/18 0033 12/25/18 0254 12/26/18 0239  WBC 10.8* 22.2* 18.8*  --   --   --  16.5* 14.0*  NEUTROABS 9.0*  --   --   --   --   --   --   --   HGB 14.7 15.0 11.6* 11.8* 12.2* 11.3* 11.5* 12.2*  HCT 41.1 42.7 31.9* 33.0* 33.6* 30.4* 31.5* 33.1*  MCV 93.6 94.9 92.2  --   --   --  91.0 90.7  PLT 207 205 121*  --   --   --  122* 132*      Lab Results  Component Value Date   HEPBSAG Negative 09/26/2018   HEPBIGM Negative 09/26/2018      Microbiology:  Recent Results (from the past 240 hour(s))  SARS CORONAVIRUS 2 (TAT 6-24 HRS) Nasopharyngeal Nasopharyngeal Swab     Status: None  Collection Time: 12/22/18 10:25 PM   Specimen: Nasopharyngeal Swab  Result Value Ref Range Status   SARS Coronavirus 2 NEGATIVE NEGATIVE Final    Comment: (NOTE) SARS-CoV-2 target nucleic acids are NOT DETECTED. The SARS-CoV-2 RNA is generally detectable in upper and lower respiratory specimens during the acute phase of infection. Negative results do not preclude SARS-CoV-2 infection, do not rule out co-infections with other pathogens, and should not be used as the sole basis for treatment or other patient management decisions. Negative results must be combined with clinical observations, patient history, and epidemiological information. The expected result is Negative. Fact Sheet for Patients: SugarRoll.be Fact Sheet for Healthcare Providers: https://www.woods-mathews.com/ This test is not yet approved or cleared by the [place] FDA and  has been authorized for detection and/or diagnosis of SARS-CoV-2 by FDA under an Emergency Use Authorization (EUA). This EUA will remain  in effect (meaning this test can be used) for the duration of the COVID-19 declaration under Section 56 4(b)(1) of the Act, 21 U.S.C. section 360bbb-3(b)(1), unless the  authorization is terminated or revoked sooner. Performed at [facility], [place] [address], [place], [place] [address]   MRSA PCR Screening     Status: None   Collection Time: 12/23/18  2:32 AM   Specimen: Nasal Mucosa; Nasopharyngeal  Result Value Ref Range Status   MRSA by PCR NEGATIVE NEGATIVE Final    Comment:        The GeneXpert MRSA Assay (FDA approved for NASAL specimens only), is one component of a comprehensive MRSA colonization surveillance program. It is not intended to diagnose MRSA infection nor to guide or monitor treatment for MRSA infections. Performed at [facility], [address] [place], [place] [address]   SARS Coronavirus 2 [facility], Performed in [facility] hospital lab) Nasopharyngeal Nasopharyngeal Swab     Status: None   Collection Time: 12/23/18  8:12 AM   Specimen: Nasopharyngeal Swab  Result Value Ref Range Status   SARS Coronavirus 2 NEGATIVE NEGATIVE Final    Comment: (NOTE) If result is NEGATIVE SARS-CoV-2 target nucleic acids are NOT DETECTED. The SARS-CoV-2 RNA is generally detectable in upper and lower  respiratory specimens during the acute phase of infection. The lowest  concentration of SARS-CoV-2 viral copies this assay can detect is 250  copies / mL. A negative result does not preclude SARS-CoV-2 infection  and should not be used as the sole basis for treatment or other  patient management decisions.  A negative result may occur with  improper specimen collection / handling, submission of specimen other  than nasopharyngeal swab, presence of viral mutation(s) within the  areas targeted by this assay, and inadequate number of viral copies  (<250 copies / mL). A negative result must be combined with clinical  observations, patient history, and epidemiological information. If result is POSITIVE SARS-CoV-2 target nucleic acids are DETECTED. The SARS-CoV-2 RNA is generally detectable in upper and lower  respiratory  specimens dur ing the acute phase of infection.  Positive  results are indicative of active infection with SARS-CoV-2.  Clinical  correlation with patient history and other diagnostic information is  necessary to determine patient infection status.  Positive results do  not rule out bacterial infection or co-infection with other viruses. If result is PRESUMPTIVE POSTIVE SARS-CoV-2 nucleic acids MAY BE PRESENT.   A presumptive positive result was obtained on the submitted specimen  and confirmed on repeat testing.  While 2019 novel coronavirus  (SARS-CoV-2) nucleic acids may  be present in the submitted sample  additional confirmatory testing may be necessary for epidemiological  and / or clinical management purposes  to differentiate between  SARS-CoV-2 and other Sarbecovirus currently known to infect humans.  If clinically indicated additional testing with an alternate test  methodology [phone]) is advised. The SARS-CoV-2 RNA is generally  detectable in upper and lower respiratory sp ecimens during the acute  phase of infection. The expected result is Negative. Fact Sheet for Patients:  StrictlyIdeas.no Fact Sheet for Healthcare Providers: BankingDealers.co.za This test is not yet approved or cleared by the [place] FDA and has been authorized for detection and/or diagnosis of SARS-CoV-2 by FDA under an Emergency Use Authorization (EUA).  This EUA will remain in effect (meaning this test can be used) for the duration of the COVID-19 declaration under Section 564(b)(1) of the Act, 21 U.S.C. section 360bbb-3(b)(1), unless the authorization is terminated or revoked sooner. Performed at [facility], [address] [place], [place] [address]   Body fluid culture     Status: None (Preliminary result)   Collection Time: 12/23/18 12:29 PM   Specimen: Abdomen; Body Fluid  Result Value Ref Range Status   Specimen Description    Final    ABDOMEN Performed at [facility], [address] [place], [place] [address]    Special Requests   Final    NONE Performed at [facility], [address] [place], [place] [address]    Gram Stain   Final    MODERATE WBC PRESENT, PREDOMINANTLY PMN NO ORGANISMS SEEN    Culture   Final    NO GROWTH 3 DAYS Performed at [place] [address] [place], [place] [address]    Report Status PENDING  Incomplete    Coagulation Studies: No results for input(s): LABPROT, INR in the last 72 hours.  Urinalysis: No results for input(s): COLORURINE, LABSPEC, PHURINE, GLUCOSEU, HGBUR, BILIRUBINUR, KETONESUR, PROTEINUR, UROBILINOGEN, NITRITE, LEUKOCYTESUR in the last 72 hours.  Invalid input(s): APPERANCEUR    Imaging: No results found.   Medications:   . sodium chloride Stopped (12/26/18 0431)  . cefTRIAXone (ROCEPHIN)  IV Stopped (12/25/18 1102)  . metronidazole 500 mg (12/26/18 0431)   . Chlorhexidine Gluconate Cloth  6 each Topical Daily  . mouth rinse  15 mL Mouth Rinse q12n4p  . pantoprazole (PROTONIX) IV  40 mg Intravenous Q12H   sodium chloride, acetaminophen **OR** acetaminophen, ondansetron **OR** ondansetron (ZOFRAN) IV, traMADol  Assessment/ Plan:  58 y.o. Caucasian male with medical problems of hepatocellular carcinoma and alcoholic cirrhosis, TIA, who was admitted to [facility] on 12/22/2018 for evaluation of worsening abdominal swelling and pain for the past 2 weeks prior to arrival.   1.  Acute kidney injury, IV contrast exposure October 3 Acute kidney injury multifactorial likely secondary to IV contrast exposure and large volume paracentesis causing hemodynamic instability.    On 9/29 patient underwent paracentesis of 10.6 L.  No albumin was given. - supportive care -Serum creatinine improved to 1.08  2.  Severe hyperkalemia. -Agree with shifting measures and Lokelma -Follow low potassium diet -Agree with discontinuing  spironolactone  3.  Hyponatremia - Hyponatremia may be related to liver cirrhosis and worsening Renal function -Consider free water restriction to less than 1.2 L/day  4.  alcoholic cirrhosis -Patient reports he has quit drinking for the past 5 months  5.  Hepatocellular carcinoma status post ablation November 21, 2018   LOS: [place] 10/7/202011:46 AM  [facility]  [place], [place]  Note: This note was prepared with Dragon dictation. Any transcription errors are unintentional
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
## 2018-12-27 DIAGNOSIS — Z66 Do not resuscitate: Secondary | ICD-10-CM

## 2018-12-27 LAB — SODIUM
Sodium: 119 mmol/L — CL (ref 135–145)
Sodium: 117 mmol/L — CL (ref 135–145)
Sodium: 117 mmol/L — CL (ref 135–145)

## 2018-12-27 LAB — BASIC METABOLIC PANEL
Anion gap: 7 (ref 5–15)
BUN: 41 mg/dL — ABNORMAL HIGH (ref 6–20)
CO2: 20 mmol/L — ABNORMAL LOW (ref 22–32)
Calcium: 7.8 mg/dL — ABNORMAL LOW (ref 8.9–10.3)
Chloride: 92 mmol/L — ABNORMAL LOW (ref 98–111)
Creatinine, Ser: 1.13 mg/dL (ref 0.61–1.24)
GFR calc Af Amer: >60 mL/min (ref >60)
GFR calc non Af Amer: >60 mL/min (ref >60)
Glucose, Bld: 116 mg/dL — ABNORMAL HIGH (ref 70–99)
Potassium: 4.1 mmol/L (ref 3.5–5.1)
Sodium: 119 mmol/L — CL (ref 135–145)

## 2018-12-27 LAB — BODY FLUID CULTURE: Culture: NO GROWTH

## 2018-12-27 MED ORDER — MIDODRINE HCL 5 MG PO TABS
10.0000 mg | ORAL_TABLET | Freq: Three times a day (TID) | ORAL | Status: DC
  Administered 2018-12-27 – 2018-12-30 (×10): 10 mg via ORAL
  Filled 2018-12-27 (×10): qty 2

## 2018-12-27 MED ORDER — TORSEMIDE 20 MG PO TABS
20.0000 mg | ORAL_TABLET | Freq: Every day | ORAL | Status: DC
  Administered 2018-12-27 – 2018-12-28 (×2): 20 mg via ORAL
  Filled 2018-12-27 (×2): qty 1

## 2018-12-27 MED ORDER — OXYCODONE HCL 5 MG PO TABS
5.0000 mg | ORAL_TABLET | Freq: Four times a day (QID) | ORAL | Status: DC | PRN
  Administered 2018-12-27 – 2018-12-31 (×11): 5 mg via ORAL
  Filled 2018-12-27 (×11): qty 1

## 2018-12-27 MED ORDER — SODIUM CHLORIDE 1 G PO TABS
1.0000 g | ORAL_TABLET | Freq: Two times a day (BID) | ORAL | Status: DC
  Administered 2018-12-27 – 2018-12-31 (×8): 1 g via ORAL
  Filled 2018-12-27 (×10): qty 1

## 2018-12-27 NOTE — Progress Notes (Signed)
[facility], [place] 12/27/18  Subjective:   Patient feels poorly today Low appetite Na remains low  Objective:  Vital signs in last 24 hours:  Temp:  [97.8 F (36.6 C)-98.4 F (36.9 C)] 97.8 F (36.6 C) (10/08 0750) Pulse Rate:  [97-107] 107 (10/08 0750) Resp:  [20] 20 (10/08 0750) BP: (99-106)/(68-73) 101/73 (10/08 0750) SpO2:  [99 %] 99 % (10/08 0750) Weight:  [62.2 kg] 62.2 kg (10/08 0331)  Weight change: 2.041 kg Filed Weights   12/25/18 0344 12/26/18 0356 12/27/18 0331  Weight: 59.5 kg 60.2 kg 62.2 kg    Intake/Output:    Intake/Output Summary (Last 24 hours) at 12/27/2018 1057 Last data filed at 12/27/2018 1020 Gross per 24 hour  Intake 678.04 ml  Output 1 ml  Net 677.04 ml     Physical Exam: General:  Laying in the bed, no acute distress, thin cachectic  HEENT Temporal wasting,  icterus, moist oral mucous membranes  Pulm/lungs  clear to auscultation bilaterally, room air  CVS/Heart  regular rhythm  Abdomen:   Distended, mild tenderness left upper quadrant  Extremities:  No peripheral edema  Neurologic:  Alert, oriented  Skin:  Warm, dry    Basic Metabolic Panel:  Recent Labs  Lab 12/23/18 0545 12/24/18 0430 12/24/18 1458 12/24/18 2110 12/25/18 0254  12/26/18 0239 12/26/18 0813 12/26/18 1425 12/26/18 2007 12/27/18 0227 12/27/18 0838  NA 123* 120* 117* 117* 118*   < > 122* 123* 122* 119* 119* 119*  K 5.6* 6.6* 5.5* 5.1 4.8  --  4.4  --   --   --  4.1  --   CL 96* 93*  --   --  93*  --  95*  --   --   --  92*  --   CO2 15* 21*  --   --  21*  --  22  --   --   --  20*  --   GLUCOSE 104* 98  --   --  83  --  107*  --   --   --  116*  --   BUN 35* 49*  --   --  45*  --  41*  --   --   --  41*  --   CREATININE 1.45* 1.76*  --   --  1.23  --  1.08  --   --   --  1.13  --   CALCIUM 8.5* 8.4*  --   --  8.1*  --  8.1*  --   --   --  7.8*  --    < > = values in this interval not displayed.     CBC: Recent Labs  Lab  12/22/18 2029 12/23/18 0545 12/24/18 0430 12/24/18 1247 12/24/18 1832 12/25/18 0033 12/25/18 0254 12/26/18 0239  WBC 10.8* 22.2* 18.8*  --   --   --  16.5* 14.0*  NEUTROABS 9.0*  --   --   --   --   --   --   --   HGB 14.7 15.0 11.6* 11.8* 12.2* 11.3* 11.5* 12.2*  HCT 41.1 42.7 31.9* 33.0* 33.6* 30.4* 31.5* 33.1*  MCV 93.6 94.9 92.2  --   --   --  91.0 90.7  PLT 207 205 121*  --   --   --  122* 132*      Lab Results  Component Value Date   HEPBSAG Negative 09/26/2018   HEPBIGM Negative 09/26/2018  Microbiology:  Recent Results (from the past 240 hour(s))  SARS CORONAVIRUS 2 (TAT 6-24 HRS) Nasopharyngeal Nasopharyngeal Swab     Status: None   Collection Time: 12/22/18 10:25 PM   Specimen: Nasopharyngeal Swab  Result Value Ref Range Status   SARS Coronavirus 2 NEGATIVE NEGATIVE Final    Comment: (NOTE) SARS-CoV-2 target nucleic acids are NOT DETECTED. The SARS-CoV-2 RNA is generally detectable in upper and lower respiratory specimens during the acute phase of infection. Negative results do not preclude SARS-CoV-2 infection, do not rule out co-infections with other pathogens, and should not be used as the sole basis for treatment or other patient management decisions. Negative results must be combined with clinical observations, patient history, and epidemiological information. The expected result is Negative. Fact Sheet for Patients: SugarRoll.be Fact Sheet for Healthcare Providers: https://www.woods-mathews.com/ This test is not yet approved or cleared by the [place] FDA and  has been authorized for detection and/or diagnosis of SARS-CoV-2 by FDA under an Emergency Use Authorization (EUA). This EUA will remain  in effect (meaning this test can be used) for the duration of the COVID-19 declaration under Section 56 4(b)(1) of the Act, 21 U.S.C. section 360bbb-3(b)(1), unless the authorization is terminated  or revoked sooner. Performed at [facility], [place] [address] [place], [place] [address]   MRSA PCR Screening     Status: None   Collection Time: 12/23/18  2:32 AM   Specimen: Nasal Mucosa; Nasopharyngeal  Result Value Ref Range Status   MRSA by PCR NEGATIVE NEGATIVE Final    Comment:        The GeneXpert MRSA Assay (FDA approved for NASAL specimens only), is one component of a comprehensive MRSA colonization surveillance program. It is not intended to diagnose MRSA infection nor to guide or monitor treatment for MRSA infections. Performed at [facility], [address] [place], [place] [address]   SARS Coronavirus 2 [facility] order, Performed in [facility] hospital lab) Nasopharyngeal Nasopharyngeal Swab     Status: None   Collection Time: 12/23/18  8:12 AM   Specimen: Nasopharyngeal Swab  Result Value Ref Range Status   SARS Coronavirus 2 NEGATIVE NEGATIVE Final    Comment: (NOTE) If result is NEGATIVE SARS-CoV-2 target nucleic acids are NOT DETECTED. The SARS-CoV-2 RNA is generally detectable in upper and lower  respiratory specimens during the acute phase of infection. The lowest  concentration of SARS-CoV-2 viral copies this assay can detect is 250  copies / mL. A negative result does not preclude SARS-CoV-2 infection  and should not be used as the sole basis for treatment or other  patient management decisions.  A negative result may occur with  improper specimen collection / handling, submission of specimen other  than nasopharyngeal swab, presence of viral mutation(s) within the  areas targeted by this assay, and inadequate number of viral copies  (<250 copies / mL). A negative result must be combined with clinical  observations, patient history, and epidemiological information. If result is POSITIVE SARS-CoV-2 target nucleic acids are DETECTED. The SARS-CoV-2 RNA is generally detectable in upper and lower  respiratory specimens dur ing the  acute phase of infection.  Positive  results are indicative of active infection with SARS-CoV-2.  Clinical  correlation with patient history and other diagnostic information is  necessary to determine patient infection status.  Positive results do  not rule out bacterial infection or co-infection with other viruses. If result is PRESUMPTIVE POSTIVE SARS-CoV-2 nucleic acids MAY BE PRESENT.  A presumptive positive result was obtained on the submitted specimen  and confirmed on repeat testing.  While 2019 novel coronavirus  (SARS-CoV-2) nucleic acids may be present in the submitted sample  additional confirmatory testing may be necessary for epidemiological  and / or clinical management purposes  to differentiate between  SARS-CoV-2 and other Sarbecovirus currently known to infect humans.  If clinically indicated additional testing with an alternate test  methodology [phone]) is advised. The SARS-CoV-2 RNA is generally  detectable in upper and lower respiratory sp ecimens during the acute  phase of infection. The expected result is Negative. Fact Sheet for Patients:  StrictlyIdeas.no Fact Sheet for Healthcare Providers: BankingDealers.co.za This test is not yet approved or cleared by the [place] FDA and has been authorized for detection and/or diagnosis of SARS-CoV-2 by FDA under an Emergency Use Authorization (EUA).  This EUA will remain in effect (meaning this test can be used) for the duration of the COVID-19 declaration under Section 564(b)(1) of the Act, 21 U.S.C. section 360bbb-3(b)(1), unless the authorization is terminated or revoked sooner. Performed at [facility], [address], [place], [place] [address]   Body fluid culture     Status: None   Collection Time: 12/23/18 12:29 PM   Specimen: Abdomen; Body Fluid  Result Value Ref Range Status   Specimen Description   Final    ABDOMEN Performed at [facility], [address] [place], [place] [address]    Special Requests   Final    NONE Performed at [facility], [address] [place], [place] [address]    Gram Stain   Final    MODERATE WBC PRESENT, PREDOMINANTLY PMN NO ORGANISMS SEEN    Culture   Final    NO GROWTH 3 DAYS Performed at [facility] [address], [place], [place] [address]    Report Status 12/27/2018 FINAL  Final    Coagulation Studies: No results for input(s): LABPROT, INR in the last 72 hours.  Urinalysis: No results for input(s): COLORURINE, LABSPEC, PHURINE, GLUCOSEU, HGBUR, BILIRUBINUR, KETONESUR, PROTEINUR, UROBILINOGEN, NITRITE, LEUKOCYTESUR in the last 72 hours.  Invalid input(s): APPERANCEUR    Imaging: No results found.   Medications:   . sodium chloride Stopped (12/27/18 0751)  . cefTRIAXone (ROCEPHIN)  IV 2 g (12/27/18 1034)  . metronidazole Stopped (12/27/18 0640)   . Chlorhexidine Gluconate Cloth  6 each Topical Daily  . mouth rinse  15 mL Mouth Rinse q12n4p  . pantoprazole (PROTONIX) IV  40 mg Intravenous Q12H   sodium chloride, acetaminophen **OR** acetaminophen, ondansetron **OR** ondansetron (ZOFRAN) IV, traMADol  Assessment/ Plan:  58 y.o. Caucasian male with medical problems of hepatocellular carcinoma and alcoholic cirrhosis, TIA, who was admitted to [facility] on 12/22/2018 for evaluation of worsening abdominal swelling and pain for the past 2 weeks prior to arrival.   1.  Acute kidney injury, IV contrast exposure October 3 Acute kidney injury multifactorial likely secondary to IV contrast exposure and large volume paracentesis causing hemodynamic instability.    On 9/29 patient underwent paracentesis of 10.6 L.  No albumin was given. - supportive care, S Creatinine has improved - recommend iv albumin of 25 gm with each 4 liter ascited drain PD catheter placement is not recommended due to logistics of outpatient care of cathter in a non ESRD patient    2.  Severe hyperkalemia.  - resolved -Follow low potassium diet -Agree with discontinuing spironolactone  3.  Hyponatremia - Hyponatremia may be  related to liver cirrhosis   - Consider free water restriction to less than 1.2 L/day  4.  alcoholic cirrhosis -Patient reports he has quit drinking for the past 5 months  5.  Hepatocellular carcinoma status post ablation November 21, 2018   LOS: [place] 10/8/202010:57 AM  [place], [place]  Note: This note was prepared with Dragon dictation. Any transcription errors are unintentional

## 2018-12-27 NOTE — Plan of Care (Signed)
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
## 2018-12-27 NOTE — Progress Notes (Signed)
PT Cancellation Note  Patient Details Name: David Brown MRN: [id number] DOB: 02-27-1961   Cancelled Treatment:    Reason Eval/Treat Not Completed: Patient declined, no reason specified.  PT consult received.  Chart reviewed.  Pt resting in bed upon PT arrival.  Pt reporting his back was sore and did not want to get up d/t this and the only thing he needed was a "back massage": therapist educated pt on importance of OOB mobility to improve strength and also that getting OOB may help improve his back pain but pt continued to decline physical therapy attempts (although pt stating the he appreciated what therapist was trying to do).  Nurse notified of above.  Pt stated that he has been getting OOB walking to the bathroom without any issues and that he had a walker at home that he could use if needed; pt also reporting hoping to discharge home today.  Will re-attempt PT evaluation at a later date/time as able.  Leitha Bleak, PT 12/27/18, 11:44 AM [phone]

## 2018-12-27 NOTE — Progress Notes (Signed)
[facility] NAME: Ayal Lagow    MR#:  [id number]  DATE OF BIRTH:  [birth year]  SUBJECTIVE:  CHIEF COMPLAINT:   Patient is feeling tired and wants to go home, asking to talk to his sister regarding further plan of care EGD canceled because of the electrolyte abnormalities  REVIEW OF SYSTEMS:  CONSTITUTIONAL: No fever, reporting fatigue or weakness.  EYES: No blurred or double vision.  EARS, NOSE, AND THROAT: No tinnitus or ear pain.  RESPIRATORY: No cough, shortness of breath, wheezing or hemoptysis.  CARDIOVASCULAR: No chest pain, orthopnea, edema.  GASTROINTESTINAL: No nausea, had coffee-ground vomiting, denies diarrhea or abdominal pain.  Has abdominal distention from cirrhosis GENITOURINARY: No dysuria, hematuria.  ENDOCRINE: No polyuria, nocturia,  HEMATOLOGY: No anemia, easy bruising or bleeding SKIN: No rash or lesion. MUSCULOSKELETAL: No joint pain or arthritis.   NEUROLOGIC: No tingling, numbness, weakness.  PSYCHIATRY: No anxiety or depression.   DRUG ALLERGIES:  No Known Allergies  VITALS:  Blood pressure 101/73, pulse (!) 107, temperature 97.8 F (36.6 C), temperature source Oral, resp. rate 20, height 5\' 6"  (1.676 m), weight 62.2 kg, SpO2 99 %.  PHYSICAL EXAMINATION:  GENERAL:  58 y.o.-year-old patient lying in the bed with no acute distress.  Emaciated EYES: Pupils equal, round, reactive to light and accommodation. No scleral icterus. Extraocular muscles intact.  HEENT: Head atraumatic, normocephalic. Oropharynx and nasopharynx clear.  NECK:  Supple, no jugular venous distention. No thyroid enlargement, no tenderness.  LUNGS: Normal breath sounds bilaterally, no wheezing, rales,rhonchi or crepitation. No use of accessory muscles of respiration.  CARDIOVASCULAR: S1, S2 normal. No murmurs, rubs, or gallops.  ABDOMEN: Soft, nontender, distended with ascites. Bowel sounds present.  EXTREMITIES: No pedal  edema, cyanosis, or clubbing.  NEUROLOGIC: Cranial nerves II through XII are intact. Sensation intact. Gait not checked.  PSYCHIATRIC: The patient is alert and oriented x 3.  SKIN: No obvious rash, lesion, or ulcer.    LABORATORY PANEL:   CBC Recent Labs  Lab 12/26/18 0239  WBC 14.0*  HGB 12.2*  HCT 33.1*  PLT 132*   ------------------------------------------------------------------------------------------------------------------  Chemistries  Recent Labs  Lab 12/24/18 0430  12/27/18 0227 12/27/18 0838  NA 120*   < > 119* 119*  K 6.6*   < > 4.1  --   CL 93*   < > 92*  --   CO2 21*   < > 20*  --   GLUCOSE 98   < > 116*  --   BUN 49*   < > 41*  --   CREATININE 1.76*   < > 1.13  --   CALCIUM 8.4*   < > 7.8*  --   AST 87*  --   --   --   ALT 55*  --   --   --   ALKPHOS 57  --   --   --   BILITOT 2.5*  --   --   --    < > = values in this interval not displayed.   ------------------------------------------------------------------------------------------------------------------  Cardiac Enzymes No results for input(s): TROPONINI in the last 168 hours. ------------------------------------------------------------------------------------------------------------------  RADIOLOGY:  No results found.  EKG:   Orders placed or performed during the hospital encounter of 12/22/18  . ED EKG  . ED EKG  . EKG 12-Lead  . EKG 12-Lead  . EKG 12-Lead  . EKG 12-Lead    ASSESSMENT AND PLAN:  #Hyponatremia-secondary to volume overload  and underlying hepatocellular carcinoma Sodium at 123 -120-118-121-122-119 IV fluids discontinued, monitor patient on fluid restriction at 1200 mL/day Nephrology Dr. Candiss Norse is following appreciate the recommendations Not a candidate for tolvaptan in view of end-stage liver disease  #Coffee-ground emesis Hemodynamically patient is stable Hemoglobin trended down from 15-11.6-11.5- 12.2  Seen by gastroenterology Dr. Alice Reichert , EGD got canceled  because of electrolyte abnormalities , will reconsult GI patient is medically stable  monitor hemoglobin and transfuse as needed.  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 [name] Child Pugh Class "C" cirrhosis with 10 points Outpatient follow-up with [facility] hepatology clinic after discharge, for possible tunneled peritoneal catheter placement  #Acute kidney injury-from IV contrast exposure on October 3 Hydrated with IV fluids gently and avoid nephrotoxins Creatinine is improving 1.76-1.2-1.08-1.13 Nephrology is following  #Hepatocellular carcinoma status post ablation follow-up with  hepatologist as an outpatient including potential transplantation of the liver Follow-up with primary oncologist Dr. Grayland Ormond after discharge Patient seen and evaluated by Dr. Grayland Ormond and palliative care  #Failure to thrive Palliative care consulted-Josh Borders, palliative care PA is following and will talk to patient sister today  #Generalized weakness-PT eval-patient is refusing PT  Plan discussed with  RN  All the records are reviewed and case discussed with Care Management/Social Workerr. Management plans discussed with the patient, try to reach patient's Marianna Fuss at [phone], could not leave voicemail phone was busy CODE STATUS: FC   TOTAL TIME TAKING CARE OF THIS PATIENT: 35  minutes.   POSSIBLE D/C IN 1-2DAYS, DEPENDING ON CLINICAL CONDITION.  Note: This dictation was prepared with Dragon dictation along with smaller phrase technology. Any transcriptional errors that result from this process are unintentional.   [name] M.D on 12/27/2018 at 2:02 PM  Between 7am to 6pm - Pager - [phone] After  6pm go to [email or web address] - password [email or web address] [facility]  Office  [phone]  CC: Primary care physician; Patient, No Pcp Per

## 2018-12-27 NOTE — Progress Notes (Addendum)
Shift summary:  - Patient expressing strong desire to DC home today. Will discuss with MD.  - Patient is now DNR.  - [Na]+ level continues to drop, now hypotensive as well. MD notified. Order for torsemide, midodrine, and NaCl tabs initiated.

## 2018-12-27 NOTE — Telephone Encounter (Signed)
Call returned to [facility] and informed that Dr Grayland Ormond said yes to Phone call order

## 2018-12-28 ENCOUNTER — Inpatient Hospital Stay: Payer: Medicaid Other

## 2018-12-28 LAB — BASIC METABOLIC PANEL
Anion gap: 6 (ref 5–15)
BUN: 47 mg/dL — ABNORMAL HIGH (ref 6–20)
CO2: 22 mmol/L (ref 22–32)
Calcium: 8.2 mg/dL — ABNORMAL LOW (ref 8.9–10.3)
Chloride: 89 mmol/L — ABNORMAL LOW (ref 98–111)
Creatinine, Ser: 1.74 mg/dL — ABNORMAL HIGH (ref 0.61–1.24)
GFR calc Af Amer: 49 mL/min — ABNORMAL LOW (ref >60)
GFR calc non Af Amer: 43 mL/min — ABNORMAL LOW (ref >60)
Glucose, Bld: 119 mg/dL — ABNORMAL HIGH (ref 70–99)
Potassium: 5.4 mmol/L — ABNORMAL HIGH (ref 3.5–5.1)
Sodium: 117 mmol/L — CL (ref 135–145)

## 2018-12-28 LAB — SODIUM
Sodium: 118 mmol/L — CL (ref 135–145)
Sodium: 117 mmol/L — CL (ref 135–145)
Sodium: 116 mmol/L — CL (ref 135–145)
Sodium: 117 mmol/L — CL (ref 135–145)

## 2018-12-28 MED ORDER — ALBUMIN HUMAN 25 % IV SOLN
12.5000 g | Freq: Once | INTRAVENOUS | Status: AC
  Administered 2018-12-28: 12.5 g via INTRAVENOUS
  Filled 2018-12-28: qty 50

## 2018-12-28 MED ORDER — SODIUM ZIRCONIUM CYCLOSILICATE 5 G PO PACK
5.0000 g | PACK | Freq: Once | ORAL | Status: AC
  Administered 2018-12-28: 5 g via ORAL
  Filled 2018-12-28: qty 1

## 2018-12-28 NOTE — TOC Progression Note (Signed)
Transition of Care [facility]) - Progression Note    Patient Details  Name: ALVOID COYT MRN: [id number] Date of Birth: [birth year]  [facility] CM/SW Contact  Ross Ludwig, [place] Phone Number: 12/28/2018, 10:56 AM  Clinical Narrative:     CSW was updated by Merrily Pew from Palliative, patient has decided that he wants to go home with hospice.  CSW spoke to patient and offered choice of hospice agencies, patient chose [facility].  CSW asked what equipment he needed and he stated a bedside commode and walker.  CSW notified Santiago Glad from [facility] about the referral, patient lives with his brother and his sister.  Patient stated he will have his family transport him back home.     Expected Discharge Plan and [place] with hospice services.                                               Social Determinants of Health (SDOH) Interventions    Readmission Risk Interventions Readmission Risk Prevention Plan 12/25/2018  Transportation Screening Complete  Medication Review Press photographer) Complete  HRI or Home Care Consult Complete  SW Recovery Care/Counseling Consult Complete  Some recent data might be hidden

## 2018-12-28 NOTE — Progress Notes (Signed)
[place] [facility] [place] NAME: David Brown    MR#:  [id number]  DATE OF BIRTH:  1960-04-14  SUBJECTIVE:  CHIEF COMPLAINT:   Patient is tight in his belly and agreeable for paracentesis.  No active bleeding.  Palliative care is following EGD canceled because of the electrolyte abnormalities  REVIEW OF SYSTEMS:  CONSTITUTIONAL: No fever, reporting fatigue or weakness.  EYES: No blurred or double vision.  EARS, NOSE, AND THROAT: No tinnitus or ear pain.  RESPIRATORY: No cough, shortness of breath, wheezing or hemoptysis.  CARDIOVASCULAR: No chest pain, orthopnea, edema.  GASTROINTESTINAL: No nausea, had coffee-ground vomiting, denies diarrhea ,reporting abdominal discomfort and fullness, has abdominal distention from cirrhosis GENITOURINARY: No dysuria, hematuria.  ENDOCRINE: No polyuria, nocturia,  HEMATOLOGY: No anemia, easy bruising or bleeding SKIN: No rash or lesion. MUSCULOSKELETAL: No joint pain or arthritis.   NEUROLOGIC: No tingling, numbness, weakness.  PSYCHIATRY: No anxiety or depression.   DRUG ALLERGIES:  No Known Allergies  VITALS:  Blood pressure (!) 87/58, pulse (!) 101, temperature 97.9 F (36.6 C), resp. rate 19, height 5\' 6"  (1.676 m), weight 62.4 kg, SpO2 98 %.  PHYSICAL EXAMINATION:  GENERAL:  58 y.o.-year-old patient lying in the bed with no acute distress.  Emaciated EYES: Pupils equal, round, reactive to light and accommodation. No scleral icterus. Extraocular muscles intact.  HEENT: Head atraumatic, normocephalic. Oropharynx and nasopharynx clear.  NECK:  Supple, no jugular venous distention. No thyroid enlargement, no tenderness.  LUNGS: Normal breath sounds bilaterally, no wheezing, rales,rhonchi or crepitation. No use of accessory muscles of respiration.  CARDIOVASCULAR: S1, S2 normal. No murmurs, rubs, or gallops.  ABDOMEN: Soft, nontender, distended with ascites.  Firm to touch, bowel sounds  present.  EXTREMITIES: No pedal edema, cyanosis, or clubbing.  NEUROLOGIC: Cranial nerves II through XII are intact. Sensation intact. Gait not checked.  PSYCHIATRIC: The patient is alert and oriented x 3.  SKIN: No obvious rash, lesion, or ulcer.    LABORATORY PANEL:   CBC Recent Labs  Lab 12/26/18 0239  WBC 14.0*  HGB 12.2*  HCT 33.1*  PLT 132*   ------------------------------------------------------------------------------------------------------------------  Chemistries  Recent Labs  Lab 12/24/18 0430  12/28/18 0225 12/28/18 0839  NA 120*   < > 117* 117*  K 6.6*   < > 5.4*  --   CL 93*   < > 89*  --   CO2 21*   < > 22  --   GLUCOSE 98   < > 119*  --   BUN 49*   < > 47*  --   CREATININE 1.76*   < > 1.74*  --   CALCIUM 8.4*   < > 8.2*  --   AST 87*  --   --   --   ALT 55*  --   --   --   ALKPHOS 57  --   --   --   BILITOT 2.5*  --   --   --    < > = values in this interval not displayed.   ------------------------------------------------------------------------------------------------------------------  Cardiac Enzymes No results for input(s): TROPONINI in the last 168 hours. ------------------------------------------------------------------------------------------------------------------  RADIOLOGY:  No results found.  EKG:   Orders placed or performed during the hospital encounter of 12/22/18  . ED EKG  . ED EKG  . EKG 12-Lead  . EKG 12-Lead  . EKG 12-Lead  . EKG 12-Lead    ASSESSMENT AND PLAN:  #Hyponatremia-secondary to  volume overload and underlying hepatocellular carcinoma Sodium at 123 -120-118-121-122-119-117 IV fluids discontinued, monitor patient on fluid restriction at 1200 mL/day Nephrology Dr. Candiss Norse is following appreciate the recommendations Not a candidate for tolvaptan in view of end-stage liver disease Salt tablets  #Coffee-ground emesis Hemodynamically patient is stable Hemoglobin trended down from 15-11.6-11.5- 12.2  Seen by  gastroenterology Dr. Alice Reichert , EGD got canceled because of electrolyte abnormalities , will reconsult GI patient is medically stable  monitor hemoglobin and transfuse as needed.  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 [name] Child Pugh Class "C" cirrhosis with 10 points Patient requires  tunneled peritoneal catheter placement possibly related to the discharge.  Discussed with interventional radiologist Dr. Yehuda Mao, his schedule is so full today until 8 PM and cannot get the procedure and recommending outpatient Pleurx catheter placement scheduled for 01/04/2019 at 10 AM.  Will talk to [facility] radiology to see if they can get this procedure done before October 16 while patient is inpatient.  Call placed and awaiting callback For repeat paracentesis today  #Acute kidney injury-from IV contrast exposure on October 3 Hydrated with IV fluids gently and avoid nephrotoxins Creatinine is improving 1.76-1.2-1.08- 1.74 Nephrology is following.  Discussed with Dr. Candiss Norse  #Hepatocellular carcinoma status post ablation follow-up with  hepatologist as an outpatient including potential transplantation of the liver Follow-up with primary oncologist Dr. Grayland Ormond after discharge Patient seen and evaluated by Dr. Grayland Ormond and palliative care  #Failure to thrive Palliative care consulted-Josh Borders, palliative care PA is following and will talk to Dr. Grayland Ormond to see if he can be palliative care attending after patient gets discharged I will discussed with Santiago Glad hospice RN regarding the same  #Generalized weakness-PT eval-patient is refusing PT  Plan discussed with  RN, patient and patient's brother, he is agreeable  with the plan  All the records are reviewed and case discussed with Care Management/Social Workerr. Management plans discussed with the patient, try to reach patient's Marianna Fuss at [phone], could not leave voicemail phone.  Plan was discussed with patient's brother, he is agreeable with the plan of care CODE STATUS: FC   TOTAL TIME TAKING CARE OF THIS PATIENT: 35  minutes.   POSSIBLE D/C IN 2-3 DAYS, DEPENDING ON CLINICAL CONDITION.  Note: This dictation was prepared with Dragon dictation along with smaller phrase technology. Any transcriptional errors that result from this process are unintentional.   [name] M.D on 12/28/2018 at 1:21 PM  Between 7am to 6pm - Pager - [phone] After 6pm go to [email or web address] - password [email or web address] [facility]  Office  [phone]  CC: Primary care physician; Patient, No Pcp Per

## 2018-12-28 NOTE — Plan of Care (Signed)
  Problem: Education: Goal: Knowledge of General Education information will improve Description: Including pain rating scale, medication(s)/side effects and non-pharmacologic comfort measures Outcome: Not Progressing Note: Patient confused as to the plan for today and the weekend. Appears it is still being developed. Hospice care nurse involved, along with palliative care services. Will continue to monitor and update as able. David Brown [facility]

## 2018-12-28 NOTE — Progress Notes (Signed)
New referral for [facility] hospice services at home received from [place]. DME discussed, per Randall Hiss patient has requested a rolling walker and BSC. Patient is no longer discharging today due to plan for placement of pleurx catheter for intermittent draining of ascites. Paracentesis performed today removed 7 liters of fluid.  Visit made to patient, he was alert but "wore slap out". He is agreeable to hospice services and confirmed that he lives with his bother, questions answered. DME ordered. Patient information faxed to referral. Discharge pending pleurx placement. If patient discharges over th weekend please contact [phone], also he will need pleurx supplies at discharge. Flo Shanks BSN, RN, [facility] [phone]

## 2018-12-28 NOTE — Plan of Care (Signed)
  Problem: Clinical Measurements: Goal: Will remain free from infection Outcome: Progressing Goal: Diagnostic test results will improve Outcome: Progressing   Problem: Nutrition: Goal: Adequate nutrition will be maintained Outcome: Progressing   Problem: Safety: Goal: Ability to remain free from injury will improve Outcome: Progressing   Problem: Skin Integrity: Goal: Risk for impaired skin integrity will decrease Outcome: Progressing   

## 2018-12-28 NOTE — Procedures (Signed)
US paracentesis without difficulty  Complications:  None  Blood Loss: none  See dictation in canopy pacs  

## 2018-12-28 NOTE — Progress Notes (Signed)
PT Cancellation Note  Patient Details Name: David Brown MRN: [id number] DOB: Jan 24, 1961   Cancelled Treatment:    Reason Eval/Treat Not Completed: Medical issues which prohibited therapy(Chart reviewed for re-attempt at initial evaluation.  Patient's sodium remains critically low; contraindicated for exertional activity at this time.  Will hold at this time and re-attempt at later time/date as medically appropriate and patient agreeable.)   Leaann Nevils H. Owens Shark, PT, DPT, NCS 12/28/18, 10:17 AM [phone]

## 2018-12-28 NOTE — Progress Notes (Signed)
Patient's MEWS is 2, but that isn't new. Patient may d/c w/ hospice today if he is agreeable. Will continue to monitor. Wenda Low [facility]
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
## 2018-12-29 LAB — BASIC METABOLIC PANEL
Anion gap: 9 (ref 5–15)
BUN: 62 mg/dL — ABNORMAL HIGH (ref 6–20)
CO2: 18 mmol/L — ABNORMAL LOW (ref 22–32)
Calcium: 7.9 mg/dL — ABNORMAL LOW (ref 8.9–10.3)
Chloride: 91 mmol/L — ABNORMAL LOW (ref 98–111)
Creatinine, Ser: 2.67 mg/dL — ABNORMAL HIGH (ref 0.61–1.24)
GFR calc Af Amer: 29 mL/min — ABNORMAL LOW (ref >60)
GFR calc non Af Amer: 25 mL/min — ABNORMAL LOW (ref >60)
Glucose, Bld: 66 mg/dL — ABNORMAL LOW (ref 70–99)
Potassium: 5.2 mmol/L — ABNORMAL HIGH (ref 3.5–5.1)
Sodium: 118 mmol/L — CL (ref 135–145)

## 2018-12-29 LAB — SODIUM: Sodium: 117 mmol/L — CL (ref 135–145)

## 2018-12-29 MED ORDER — DEXTROSE 5 % IV SOLN
INTRAVENOUS | Status: DC
  Administered 2018-12-29 – 2018-12-31 (×2): via INTRAVENOUS

## 2018-12-29 MED ORDER — CEFAZOLIN SODIUM-DEXTROSE 2-4 GM/100ML-% IV SOLN
2.0000 g | Freq: Once | INTRAVENOUS | Status: AC
  Administered 2018-12-31: 2 g via INTRAVENOUS
  Filled 2018-12-29: qty 100

## 2018-12-29 MED ORDER — POLYVINYL ALCOHOL 1.4 % OP SOLN
1.0000 [drp] | Freq: Four times a day (QID) | OPHTHALMIC | Status: DC | PRN
  Filled 2018-12-29: qty 15

## 2018-12-29 MED ORDER — GLYCOPYRROLATE 0.2 MG/ML IJ SOLN
0.2000 mg | INTRAMUSCULAR | Status: DC | PRN
  Filled 2018-12-29: qty 1

## 2018-12-29 MED ORDER — DIPHENHYDRAMINE HCL 50 MG/ML IJ SOLN: 25.0000 mg | INTRAMUSCULAR | Status: DC | PRN

## 2018-12-29 MED ORDER — SODIUM CHLORIDE 0.9 % IV SOLN: 250.0000 mL | Freq: Once | INTRAVENOUS | Status: DC

## 2018-12-29 MED ORDER — GLYCOPYRROLATE 1 MG PO TABS
1.0000 mg | ORAL_TABLET | ORAL | Status: DC | PRN
  Filled 2018-12-29: qty 1

## 2018-12-29 NOTE — Plan of Care (Signed)
  Problem: Education: Goal: Knowledge of General Education information will improve Description: Including pain rating scale, medication(s)/side effects and non-pharmacologic comfort measures Outcome: Progressing   Problem: Health Behavior/Discharge Planning: Goal: Ability to manage health-related needs will improve Outcome: Not Progressing Note: Patient ordered to be comfort care only this AM after plan to transfer to the ICU cancelled. Patient awaiting d/c home with hospice / insertion of a Pleur-X catheter, which will not be until Monday at the earliest. Family & friends visiting with the patient currently. Tele order d/c'd. Most medications cancelled. May transfer to oncology unit later today. Will continue to monitor overall status. Wenda Low [facility]

## 2018-12-29 NOTE — Progress Notes (Signed)
Patient requested I speak with sister about current conditions. Patient is aware of blood pressure and current choices available. [name], NP wants staff to discuss options with patients before transferring to stepdown since patient is expected to be discharge with hospice. Sister wants to speak with David Brown before making a decision. Will update.

## 2018-12-29 NOTE — Progress Notes (Signed)
CRITICAL VALUE ALERT  Critical Value: sodium 117  Date & Time Notied:  12-28-18.  2136  Provider Notified: Dr Mariana Kaufman patel  Orders Received/Actions taken: no new orders

## 2018-12-29 NOTE — Progress Notes (Signed)
Blood pressure did not improve after bolus. Patient is hypotensive. Notified [name] NP, received orders to transfer to stepdown.

## 2018-12-29 NOTE — Progress Notes (Signed)
[name] NAME: David Brown    MR#:  [id number]  DATE OF BIRTH:  27-Jun-1960  SUBJECTIVE:  Mains hypotensive, abdominal pain continues.  He wants to be comfortable REVIEW OF SYSTEMS:  CONSTITUTIONAL: No fever, reporting fatigue or weakness.  EYES: No blurred or double vision.  EARS, NOSE, AND THROAT: No tinnitus or ear pain.  RESPIRATORY: No cough, shortness of breath, wheezing or hemoptysis.  CARDIOVASCULAR: No chest pain, orthopnea, edema.  GASTROINTESTINAL: No nausea, had coffee-ground vomiting, denies diarrhea ,reporting abdominal discomfort and fullness, has abdominal distention  GENITOURINARY: No dysuria, hematuria.  ENDOCRINE: No polyuria, nocturia,  HEMATOLOGY: No anemia, easy bruising or bleeding SKIN: No rash or lesion. MUSCULOSKELETAL: No joint pain or arthritis.   NEUROLOGIC: No tingling, numbness, weakness.  PSYCHIATRY: No anxiety or depression.   DRUG ALLERGIES:  No Known Allergies  VITALS:  Blood pressure (!) 81/58, pulse 93, temperature 97.9 F (36.6 C), temperature source Oral, resp. rate 20, height 5\' 6"  (1.676 m), weight 62.4 kg, SpO2 94 %.  PHYSICAL EXAMINATION:  GENERAL:  58 y.o.-year-old patient lying in the bed with no acute distress.  Emaciated EYES: Pupils equal, round, reactive to light and accommodation. No scleral icterus. Extraocular muscles intact.  HEENT: Head atraumatic, normocephalic. Oropharynx and nasopharynx clear.  NECK:  Supple, no jugular venous distention. No thyroid enlargement, no tenderness.  LUNGS: Normal breath sounds bilaterally, no wheezing, rales,rhonchi or crepitation. No use of accessory muscles of respiration.  CARDIOVASCULAR: S1, S2 normal. No murmurs, rubs, or gallops.  ABDOMEN: Generalized tenderness, distended with ascites.  Firm to touch, bowel sounds present.  EXTREMITIES: No pedal edema, cyanosis, or clubbing.  NEUROLOGIC: Cranial nerves II through XII are  intact. Sensation intact. Gait not checked.  PSYCHIATRIC: The patient is alert and oriented x 3.  SKIN: No obvious rash, lesion, or ulcer.  LABORATORY PANEL:   CBC Recent Labs  Lab 12/26/18 0239  WBC 14.0*  HGB 12.2*  HCT 33.1*  PLT 132*   ------------------------------------------------------------------------------------------------------------------  Chemistries  Recent Labs  Lab 12/24/18 0430  12/29/18 0408  NA 120*   < > 118*  K 6.6*   < > 5.2*  CL 93*   < > 91*  CO2 21*   < > 18*  GLUCOSE 98   < > 66*  BUN 49*   < > 62*  CREATININE 1.76*   < > 2.67*  CALCIUM 8.4*   < > 7.9*  AST 87*  --   --   ALT 55*  --   --   ALKPHOS 57  --   --   BILITOT 2.5*  --   --    < > = values in this interval not displayed.   ------------------------------------------------------------------------------------------------------------------  Cardiac Enzymes No results for input(s): TROPONINI in the last 168 hours. ------------------------------------------------------------------------------------------------------------------  RADIOLOGY:  US Paracentesis  Result Date: 12/28/2018 INDICATION: Recurrent malignant ascites EXAM: ULTRASOUND GUIDED  PARACENTESIS MEDICATIONS: None. COMPLICATIONS: None immediate. PROCEDURE: Informed written consent was obtained from the patient after a discussion of the risks, benefits and alternatives to treatment. A timeout was performed prior to the initiation of the procedure. Initial ultrasound scanning demonstrates a large amount of ascites within the right lower abdominal quadrant. The right lower abdomen was prepped and draped in the usual sterile fashion. 1% lidocaine was used for local anesthesia. Following this, a 6 Fr Safe-T-Centesis catheter was introduced. An ultrasound image was saved for documentation purposes. The paracentesis was performed.  The catheter was removed and a dressing was applied. The patient tolerated the procedure well without  immediate post procedural complication. FINDINGS: A total of approximately 7 L of clear yellow fluid was removed. Samples were sent to the laboratory as requested by the clinical team. IMPRESSION: Successful ultrasound-guided paracentesis yielding 7 liters of peritoneal fluid. Electronically Signed   By: Inez Catalina M.D.   On: 12/28/2018 15:37    EKG:   Orders placed or performed during the hospital encounter of 12/22/18  . ED EKG  . ED EKG  . EKG 12-Lead  . EKG 12-Lead  . EKG 12-Lead  . EKG 12-Lead    ASSESSMENT AND PLAN:  #Hyponatremia-secondary to volume overload and underlying hepatocellular carcinoma  #Coffee-ground emesis  #Hyperkalemia  #Liver cirrhosis end-stage with ascites-recurrent from alcoholism Status post paracentesis, 6 L extracted by intensivist -Patient will benefit from abdominal pleurx for home management of ascites -He has outpatient Pleurx catheter placement scheduled for 01/04/2019 at 10 AM.considering his clinical condition, I doubt he will be able to coordinate that as an outpatient.  Will talk to [facility] radiology to see if they can get this procedure done while patient is inpatient.  Call placed and awaiting callback from on-call interventional radiology -Ultrasound-guided paracentesis on 10/9 with 7 L fluid removal  #Acute kidney injury  #Hepatocellular carcinoma status post ablation   #Failure to thrive  #Generalized weakness  *Hypotension  Plan discussed with  RN, patient's sister -everyone is agreeable to keep him comfortable and get him home under hospice care if and when possible  All the records are reviewed and case discussed with Care Management/Social Worker.   CODE STATUS: DNR/comfort care  TOTAL TIME TAKING CARE OF THIS PATIENT: 35  minutes.   POSSIBLE D/C IN 1-2 DAYS, DEPENDING ON CLINICAL CONDITION.  Note: This dictation was prepared with Dragon dictation along with smaller phrase technology. Any transcriptional errors that  result from this process are unintentional.   Max Sane M.D on 12/29/2018 at 1:09 PM  Between 7am to 6pm - Pager - [phone] After 6pm go to [email or web address] - password [email or web address] [facility]  Office  [phone]  CC: Primary care physician; Patient, No Pcp Per

## 2018-12-29 NOTE — Progress Notes (Signed)
Patient w/ no vital signs recorded this AM d/t comfort care only status and instruction from Dr. Manuella Ghazi they didn't need taken. Will keep patient comfortable. Patient with several visitors in the room currently. Will continue to monitor. Wenda Low [facility]

## 2018-12-29 NOTE — Progress Notes (Signed)
Just got off the phone with interventional radiology.  They have placed him on scheduled to get Pleurx catheter placed on Monday

## 2018-12-29 NOTE — Progress Notes (Addendum)
CRITICAL VALUE ALERT  Critical Value:  118  Date & Time Notied:  12-29-18  0551  Provider Notified: Rufina Falco NP  Orders Received/Actions taken: no new orders

## 2018-12-29 NOTE — Progress Notes (Signed)
  CRITICAL VALUE ALERT  Critical Value:  117  Date & Time Notied:  12/29/18  0345  Provider Notified: Rufina Falco Np  Orders Received/Actions taken: no new orders

## 2018-12-29 NOTE — Progress Notes (Signed)
Pt blood pressure 80/54. Rufina Falco Np notified, orders given for 250cc bolus of 0.9% NS. Pt asymptomatic. Q18min vitals.

## 2018-12-30 NOTE — Progress Notes (Addendum)
Nutrition Brief Note  Patient identified to be seen for Malnutrition Screening Tool (MST). Chart reviewed and discussed with RN.  Patient has now transitioned to comfort care.   No nutrition interventions warranted at this time. Please consult RD as needed.   Willey Blade, MS, [place], LDN Office: [phone] Pager: [phone] After Hours/Weekend Pager: [phone]

## 2018-12-30 NOTE — Progress Notes (Signed)
Pt with no orders for telemetry. Pt moved off the floor. Discussed with pt, and ok with it. Pt tx to room 122. Brother called and updated.

## 2018-12-30 NOTE — Progress Notes (Signed)
[place] [facility] [place] NAME: David Brown    MR#:  [id number]  DATE OF BIRTH:  09-Aug-1960  SUBJECTIVE:  abdominal pain continues.  He is comfortable REVIEW OF SYSTEMS:  CONSTITUTIONAL: No fever, reporting fatigue or weakness.  EYES: No blurred or double vision.  EARS, NOSE, AND THROAT: No tinnitus or ear pain.  RESPIRATORY: No cough, shortness of breath, wheezing or hemoptysis.  CARDIOVASCULAR: No chest pain, orthopnea, edema.  GASTROINTESTINAL: No nausea, had coffee-ground vomiting, denies diarrhea ,reporting abdominal discomfort and fullness, has abdominal distention  GENITOURINARY: No dysuria, hematuria.  ENDOCRINE: No polyuria, nocturia,  HEMATOLOGY: No anemia, easy bruising or bleeding SKIN: No rash or lesion. MUSCULOSKELETAL: No joint pain or arthritis.   NEUROLOGIC: No tingling, numbness, weakness.  PSYCHIATRY: No anxiety or depression.   DRUG ALLERGIES:  No Known Allergies  VITALS:  Blood pressure (!) 83/72, pulse 91, temperature 97.9 F (36.6 C), resp. rate 19, height 5\' 6"  (1.676 m), weight 57.4 kg, SpO2 98 %.  PHYSICAL EXAMINATION:  GENERAL:  58 y.o.-year-old patient lying in the bed with no acute distress.  Emaciated EYES: Pupils equal, round, reactive to light and accommodation. No scleral icterus. Extraocular muscles intact.  HEENT: Head atraumatic, normocephalic. Oropharynx and nasopharynx clear.  NECK:  Supple, no jugular venous distention. No thyroid enlargement, no tenderness.  LUNGS: Normal breath sounds bilaterally, no wheezing, rales,rhonchi or crepitation. No use of accessory muscles of respiration.  CARDIOVASCULAR: S1, S2 normal. No murmurs, rubs, or gallops.  ABDOMEN: Generalized tenderness, distended with ascites.  Firm to touch, bowel sounds present.  EXTREMITIES: No pedal edema, cyanosis, or clubbing.  NEUROLOGIC: Cranial nerves II through XII are intact. Sensation intact. Gait not checked.   PSYCHIATRIC: The patient is alert and oriented x 3.  SKIN: No obvious rash, lesion, or ulcer.  LABORATORY PANEL:   CBC Recent Labs  Lab 12/26/18 0239  WBC 14.0*  HGB 12.2*  HCT 33.1*  PLT 132*   ------------------------------------------------------------------------------------------------------------------  Chemistries  Recent Labs  Lab 12/24/18 0430  12/29/18 0408  NA 120*   < > 118*  K 6.6*   < > 5.2*  CL 93*   < > 91*  CO2 21*   < > 18*  GLUCOSE 98   < > 66*  BUN 49*   < > 62*  CREATININE 1.76*   < > 2.67*  CALCIUM 8.4*   < > 7.9*  AST 87*  --   --   ALT 55*  --   --   ALKPHOS 57  --   --   BILITOT 2.5*  --   --    < > = values in this interval not displayed.   ------------------------------------------------------------------------------------------------------------------  Cardiac Enzymes No results for input(s): TROPONINI in the last 168 hours. ------------------------------------------------------------------------------------------------------------------  RADIOLOGY:  US Paracentesis  Result Date: 12/28/2018 INDICATION: Recurrent malignant ascites EXAM: ULTRASOUND GUIDED  PARACENTESIS MEDICATIONS: None. COMPLICATIONS: None immediate. PROCEDURE: Informed written consent was obtained from the patient after a discussion of the risks, benefits and alternatives to treatment. A timeout was performed prior to the initiation of the procedure. Initial ultrasound scanning demonstrates a large amount of ascites within the right lower abdominal quadrant. The right lower abdomen was prepped and draped in the usual sterile fashion. 1% lidocaine was used for local anesthesia. Following this, a 6 Fr Safe-T-Centesis catheter was introduced. An ultrasound image was saved for documentation purposes. The paracentesis was performed. The catheter was removed and a dressing  was applied. The patient tolerated the procedure well without immediate post procedural complication. FINDINGS:  A total of approximately 7 L of clear yellow fluid was removed. Samples were sent to the laboratory as requested by the clinical team. IMPRESSION: Successful ultrasound-guided paracentesis yielding 7 liters of peritoneal fluid. Electronically Signed   By: Inez Catalina M.D.   On: 12/28/2018 15:37    EKG:   Orders placed or performed during the hospital encounter of 12/22/18  . ED EKG  . ED EKG  . EKG 12-Lead  . EKG 12-Lead  . EKG 12-Lead  . EKG 12-Lead    ASSESSMENT AND PLAN:  #Hyponatremia-secondary to volume overload and underlying hepatocellular carcinoma  #Coffee-ground emesis  #Hyperkalemia  #Liver cirrhosis end-stage with ascites-recurrent from alcoholism Status post paracentesis, 6 L extracted by intensivist -Patient will benefit from abdominal pleurx for home management of ascites -He has outpatient Pleurx catheter placement scheduled for 01/04/2019 at 10 AM.considering his clinical condition, I doubt he will be able to coordinate that as an outpatient.  Will talk to [facility] radiology to see if they can get this procedure done while patient is inpatient.  Discussed with on-call interventional radiology yesterday who mentioned they will be able to do this on Monday 10/12 -Ultrasound-guided paracentesis on 10/9 with 7 L fluid removal  #Acute kidney injury  #Hepatocellular carcinoma status post ablation   #Failure to thrive  #Generalized weakness  *Hypotension  Plan discussed with  RN, patient's sister -everyone is agreeable to keep him comfortable and get him home under hospice care if and when possible  All the records are reviewed and case discussed with Care Management/Social Worker.   CODE STATUS: DNR/comfort care  TOTAL TIME TAKING CARE OF THIS PATIENT: 15  minutes.   POSSIBLE D/C IN 1-2 DAYS, DEPENDING ON CLINICAL CONDITION.  Note: This dictation was prepared with Dragon dictation along with smaller phrase technology. Any transcriptional errors that  result from this process are unintentional.   Max Sane M.D on 12/30/2018 at 10:47 AM  Between 7am to 6pm - Pager - [phone] After 6pm go to [email or web address] - password [email or web address] [facility]  Office  [phone]  CC: Primary care physician; Patient, No Pcp Per

## 2018-12-31 ENCOUNTER — Telehealth: Payer: Self-pay | Admitting: *Deleted

## 2018-12-31 ENCOUNTER — Inpatient Hospital Stay (HOSPITAL_COMMUNITY): Payer: Self-pay

## 2018-12-31 ENCOUNTER — Inpatient Hospital Stay: Payer: Medicaid Other

## 2018-12-31 MED ORDER — MORPHINE SULFATE (CONCENTRATE) 20 MG/ML PO SOLN: 10.0000 mg | ORAL | 0 refills | Status: AC | PRN

## 2018-12-31 MED ORDER — LORAZEPAM 0.5 MG PO TABS: 0.5000 mg | ORAL_TABLET | ORAL | 0 refills | Status: AC | PRN

## 2018-12-31 MED ORDER — MIDAZOLAM HCL 5 MG/5ML IJ SOLN
INTRAMUSCULAR | Status: AC
  Administered 2018-12-31: 14:00:00
  Filled 2018-12-31: qty 5

## 2018-12-31 MED ORDER — MORPHINE SULFATE 30 MG PO TABS: 30.0000 mg | ORAL_TABLET | ORAL | 0 refills | Status: DC | PRN

## 2018-12-31 MED ORDER — FENTANYL CITRATE (PF) 100 MCG/2ML IJ SOLN
INTRAMUSCULAR | Status: AC
  Administered 2018-12-31: 16:00:00
  Filled 2018-12-31: qty 2

## 2018-12-31 MED ORDER — MORPHINE SULFATE (PF) 2 MG/ML IV SOLN: 2.0000 mg | INTRAVENOUS | Status: DC | PRN

## 2018-12-31 MED ORDER — GLYCOPYRROLATE 1 MG PO TABS: 1.0000 mg | ORAL_TABLET | ORAL | 0 refills | Status: AC | PRN

## 2018-12-31 NOTE — Progress Notes (Signed)
[facility], [place] 12/31/18  Subjective:  Patient still feeling quite weak. States that he is having some difficulties with urination at times as well. Remains hypotensive.   Objective:  Vital signs in last 24 hours:  Temp:  [97.9 F (36.6 C)-98.3 F (36.8 C)] 98.3 F (36.8 C) (10/12 1340) Pulse Rate:  [89-103] 100 (10/12 1450) Resp:  [16-22] 18 (10/12 1450) BP: (64-84)/(44-61) 69/49 (10/12 1450) SpO2:  [93 %-100 %] 100 % (10/12 1445)  Weight change:  Filed Weights   12/27/18 0331 12/28/18 0357 12/30/18 0425  Weight: 62.2 kg 62.4 kg 57.4 kg    Intake/Output:    Intake/Output Summary (Last 24 hours) at 12/31/2018 1555 Last data filed at 12/31/2018 0502 Gross per 24 hour  Intake 492.01 ml  Output 0 ml  Net 492.01 ml     Physical Exam: General:  Laying in the bed, no acute distress, thin cachectic  HEENT  Temporal wasting,  icterus, moist oral mucous membranes  Pulm/lungs  clear to auscultation bilaterally, room air  CVS/Heart  regular rhythm  Abdomen:   Distended, mild tenderness left upper quadrant  Extremities:  No peripheral edema  Neurologic:  Alert, oriented  Skin:  Warm, dry    Basic Metabolic Panel:  Recent Labs  Lab 12/25/18 0254  12/26/18 0239  12/27/18 0227  12/28/18 0225 12/28/18 0839 12/28/18 1601 12/28/18 2003 12/29/18 0230 12/29/18 0408  NA 118*   < > 122*   < > 119*   < > 117* 117* 116* 117* 117* 118*  K 4.8  --  4.4  --  4.1  --  5.4*  --   --   --   --  5.2*  CL 93*  --  95*  --  92*  --  89*  --   --   --   --  91*  CO2 21*  --  22  --  20*  --  22  --   --   --   --  18*  GLUCOSE 83  --  107*  --  116*  --  119*  --   --   --   --  66*  BUN 45*  --  41*  --  41*  --  47*  --   --   --   --  62*  CREATININE 1.23  --  1.08  --  1.13  --  1.74*  --   --   --   --  2.67*  CALCIUM 8.1*  --  8.1*  --  7.8*  --  8.2*  --   --   --   --  7.9*   < > = values in this interval not displayed.     CBC: Recent  Labs  Lab 12/24/18 1832 12/25/18 0033 12/25/18 0254 12/26/18 0239  WBC  --   --  16.5* 14.0*  HGB 12.2* 11.3* 11.5* 12.2*  HCT 33.6* 30.4* 31.5* 33.1*  MCV  --   --  91.0 90.7  PLT  --   --  122* 132*      Lab Results  Component Value Date   HEPBSAG Negative 09/26/2018   HEPBIGM Negative 09/26/2018      Microbiology:  Recent Results (from the past 240 hour(s))  SARS CORONAVIRUS 2 (TAT 6-24 HRS) Nasopharyngeal Nasopharyngeal Swab     Status: None   Collection Time: 12/22/18 10:25 PM   Specimen: Nasopharyngeal Swab  Result Value Ref Range  Status   SARS Coronavirus 2 NEGATIVE NEGATIVE Final    Comment: (NOTE) SARS-CoV-2 target nucleic acids are NOT DETECTED. The SARS-CoV-2 RNA is generally detectable in upper and lower respiratory specimens during the acute phase of infection. Negative results do not preclude SARS-CoV-2 infection, do not rule out co-infections with other pathogens, and should not be used as the sole basis for treatment or other patient management decisions. Negative results must be combined with clinical observations, patient history, and epidemiological information. The expected result is Negative. Fact Sheet for Patients: SugarRoll.be Fact Sheet for Healthcare Providers: https://www.woods-mathews.com/ This test is not yet approved or cleared by the [place] FDA and  has been authorized for detection and/or diagnosis of SARS-CoV-2 by FDA under an Emergency Use Authorization (EUA). This EUA will remain  in effect (meaning this test can be used) for the duration of the COVID-19 declaration under Section 56 4(b)(1) of the Act, 21 U.S.C. section 360bbb-3(b)(1), unless the authorization is terminated or revoked sooner. Performed at [facility], [place] [address] [place], [place] [address]   MRSA PCR Screening     Status: None   Collection Time: 12/23/18  2:32 AM   Specimen: Nasal Mucosa;  Nasopharyngeal  Result Value Ref Range Status   MRSA by PCR NEGATIVE NEGATIVE Final    Comment:        The GeneXpert MRSA Assay (FDA approved for NASAL specimens only), is one component of a comprehensive MRSA colonization surveillance program. It is not intended to diagnose MRSA infection nor to guide or monitor treatment for MRSA infections. Performed at [facility], [address] [place],  [address]   SARS Coronavirus 2 [facility] order, Performed in [facility] hospital lab) Nasopharyngeal Nasopharyngeal Swab     Status: None   Collection Time: 12/23/18  8:12 AM   Specimen: Nasopharyngeal Swab  Result Value Ref Range Status   SARS Coronavirus 2 NEGATIVE NEGATIVE Final    Comment: (NOTE) If result is NEGATIVE SARS-CoV-2 target nucleic acids are NOT DETECTED. The SARS-CoV-2 RNA is generally detectable in upper and lower  respiratory specimens during the acute phase of infection. The lowest  concentration of SARS-CoV-2 viral copies this assay can detect is 250  copies / mL. A negative result does not preclude SARS-CoV-2 infection  and should not be used as the sole basis for treatment or other  patient management decisions.  A negative result may occur with  improper specimen collection / handling, submission of specimen other  than nasopharyngeal swab, presence of viral mutation(s) within the  areas targeted by this assay, and inadequate number of viral copies  (<250 copies / mL). A negative result must be combined with clinical  observations, patient history, and epidemiological information. If result is POSITIVE SARS-CoV-2 target nucleic acids are DETECTED. The SARS-CoV-2 RNA is generally detectable in upper and lower  respiratory specimens dur ing the acute phase of infection.  Positive  results are indicative of active infection with SARS-CoV-2.  Clinical  correlation with patient history and other diagnostic information is  necessary to determine  patient infection status.  Positive results do  not rule out bacterial infection or co-infection with other viruses. If result is PRESUMPTIVE POSTIVE SARS-CoV-2 nucleic acids MAY BE PRESENT.   A presumptive positive result was obtained on the submitted specimen  and confirmed on repeat testing.  While 2019 novel coronavirus  (SARS-CoV-2) nucleic acids may be present in the submitted sample  additional confirmatory testing may be necessary for epidemiological  and / or clinical management purposes  to differentiate between  SARS-CoV-2 and other Sarbecovirus currently known to infect humans.  If clinically indicated additional testing with an alternate test  methodology [phone]) is advised. The SARS-CoV-2 RNA is generally  detectable in upper and lower respiratory sp ecimens during the acute  phase of infection. The expected result is Negative. Fact Sheet for Patients:  StrictlyIdeas.no Fact Sheet for Healthcare Providers: BankingDealers.co.za This test is not yet approved or cleared by the [place] FDA and has been authorized for detection and/or diagnosis of SARS-CoV-2 by FDA under an Emergency Use Authorization (EUA).  This EUA will remain in effect (meaning this test can be used) for the duration of the COVID-19 declaration under Section 564(b)(1) of the Act, 21 U.S.C. section 360bbb-3(b)(1), unless the authorization is terminated or revoked sooner. Performed at [facility], [address] [place], [place] [address]   Body fluid culture     Status: None   Collection Time: 12/23/18 12:29 PM   Specimen: Abdomen; Body Fluid  Result Value Ref Range Status   Specimen Description   Final    ABDOMEN Performed at [facility], [address] [place], [place] [address]    Special Requests   Final    NONE Performed at [facility], [address] [place], [place] [address]    Gram Stain   Final     MODERATE WBC PRESENT, PREDOMINANTLY PMN NO ORGANISMS SEEN    Culture   Final    NO GROWTH 3 DAYS Performed at [place] [address], [place], [place] [address]    Report Status 12/27/2018 FINAL  Final    Coagulation Studies: No results for input(s): LABPROT, INR in the last 72 hours.  Urinalysis: No results for input(s): COLORURINE, LABSPEC, PHURINE, GLUCOSEU, HGBUR, BILIRUBINUR, KETONESUR, PROTEINUR, UROBILINOGEN, NITRITE, LEUKOCYTESUR in the last 72 hours.  Invalid input(s): APPERANCEUR    Imaging: Ir Image Guided Drainage By Percutaneous Catheter  Result Date: 12/31/2018 CLINICAL DATA:  End-stage cirrhosis, refractory ascites and need for tunneled peritoneal drainage catheter for palliative purposes. EXAM: INSERTION OF TUNNELED PERITONEAL DRAINAGE CATHETER ANESTHESIA/SEDATION: None MEDICATIONS: 2 g IV Ancef. Antibiotic was administered in an appropriate time interval for the procedure. FLUOROSCOPY TIME:  6 seconds.  2.1 mGy. PROCEDURE: The procedure, risks, benefits, and alternatives were explained to the patient. Questions regarding the procedure were encouraged and answered. The patient understands and consents to the procedure. A time-out was performed prior to initiating the procedure. The right abdominal wall was prepped with chlorhexidine in a sterile fashion, and a sterile drape was applied covering the operative field. A sterile gown and sterile gloves were used for the procedure. Local anesthesia was provided with 1% Lidocaine. Ultrasound image documentation was performed. Fluoroscopy during the procedure and fluoroscopic spot radiograph confirms appropriate catheter position. After creating a small skin incision, a 19 gauge needle was advanced into the peritoneal cavity under ultrasound guidance. A guide wire was then advanced under fluoroscopy into the peritoneal cavity. Peritoneal access was dilated serially and a 16-French peel-away sheath placed. A 15.5 French  tunneled peritoneal PleurX catheter was placed. This was tunneled from an incision 5 cm below the peritoneal access to the access site. The catheter was advanced through the peel-away sheath. The sheath was then removed. Final catheter positioning was confirmed with a fluoroscopic spot image. The peritoneal access incision was closed with subcutaneous and subcuticular 4-0 Vicryl. Dermabond was applied to the incision. A Prolene retention  suture was applied at the catheter exit site. Paracentesis was performed through the new catheter utilizing drainage bottles. COMPLICATIONS: None. FINDINGS: The catheter was placed via the right abdominal wall. Catheter course is across the midline into the left abdomen. Approximately 1.8 liters of ascites was able to be removed after catheter placement. IMPRESSION: Placement of tunneled peritoneal drainage catheter via right abdominal approach. 1.8 liters of ascites was removed today after catheter placement. Electronically Signed   By: Aletta Edouard M.D.   On: 12/31/2018 15:00     Medications:   . sodium chloride Stopped (12/29/18 0559)  . sodium chloride    . dextrose 20 mL/hr at 12/31/18 1016   . fentaNYL      . mouth rinse  15 mL Mouth Rinse q12n4p  . midazolam      . sodium chloride  1 g Oral BID WC   sodium chloride, acetaminophen **OR** acetaminophen, diphenhydrAMINE, glycopyrrolate **OR** glycopyrrolate **OR** glycopyrrolate, morphine injection, ondansetron **OR** ondansetron (ZOFRAN) IV, oxyCODONE, polyvinyl alcohol, traMADol  Assessment/ Plan:  58 y.o. Caucasian male with medical problems of hepatocellular carcinoma and alcoholic cirrhosis, TIA, who was admitted to [facility] on 12/22/2018 for evaluation of worsening abdominal swelling and pain for the past 2 weeks prior to arrival.   1.  Acute kidney injury, IV contrast exposure October 3 Acute kidney injury multifactorial likely secondary to IV contrast exposure and large volume paracentesis causing  hemodynamic instability.    On 9/29 patient underwent paracentesis of 10.6 L.  No albumin was given. -Patient continues to have diminished renal function.  Most recent EGFR was 25 with a creatinine of 2.67.  No indication for dialysis as the patient has decided upon home hospice.   2.  Severe hyperkalemia.  -Patient was taken off of spironolactone as he will now be on home hospice.  3.  Hyponatremia - Sodium continues to be low and currently 118.  May contribute to confusion and weakness.  4.  alcoholic cirrhosis -Patient reports he has quit drinking for the past 5 months  5.  Hepatocellular carcinoma status post ablation November 21, 2018   LOS: 9 Latrell [place] 10/12/20203:55 PM  [employer], [place]  Note: This note was prepared with Dragon dictation. Any transcription errors are unintentional

## 2018-12-31 NOTE — Telephone Encounter (Signed)
Yes

## 2018-12-31 NOTE — Procedures (Signed)
Interventional Radiology Procedure Note  Procedure: Tunneled peritoneal drain placement  Complications: None  Estimated Blood Loss: < 10 mL  Findings: 15.5 Fr tunneled PleurX catheter placed via RLQ approach crossing midline into left peritoneal cavity. 1.8 L of ascites removed after catheter placement.  Venetia Night. Kathlene Cote, M.D Pager:  [phone]

## 2018-12-31 NOTE — Discharge Instructions (Signed)
Hospice °Hospice is a service that is designed to provide people who are terminally ill and their families with medical, spiritual, and psychological support. Its aim is to improve your quality of life by keeping you as comfortable as possible in the final stages of life. °Who will be my providers when I begin hospice care? °Hospice teams often include: °· A nurse. °· A doctor. The hospice doctor will be available for your care, but you can include your regular doctor or nurse practitioner. °· A social worker. °· A counselor. °· A religious leader (such as a chaplain). °· A dietitian. °· Therapists. °· Trained volunteers who can help with care. °What services does hospice provide? °Hospice services can vary depending on the center or organization. Generally, they include: °· Ways to keep you comfortable, such as: °? Providing care in your home or in a home-like setting. °? Working with your family and friends to help meet your needs. °? Allowing you to enjoy the support of loved ones by receiving much of your basic care from family and friends. °· Pain relief and symptom management. The staff will supply all necessary medicines and equipment so that you can stay comfortable and alert enough to enjoy the company of your friends and family. °· Visits or care from a nurse and doctor. This may include 24-hour on-call services. °· Companionship when you are alone. °· Allowing you and your family to rest. Hospice staff may do light housekeeping, prepare meals, and run errands. °· Counseling. They will make sure your emotional, spiritual, and social needs are being met, as well as those needs of your family members. °· Spiritual care. This will be individualized to meet your needs and your family's needs. It may involve: °? Helping you and your family understand the dying process. °? Helping you say goodbye to your family and friends. °? Performing a specific religious ceremony or ritual. °· Massage. °· Nutrition  therapy. °· Physical and occupational therapy. °· Short-term inpatient care, if something cannot be managed in the home. °· Art or music therapy. °· Bereavement support for grieving family members. °When should hospice care begin? °Most people who use hospice are believed to have less than 6 months to live. °· Your family and health care providers can help you decide when hospice services should begin. °· If you live longer than 6 months but your condition does not improve, your doctor may be able to approve you for continued hospice care. °· If your condition improves, you may discontinue the program. °What should I consider before selecting a program? °Most hospice programs are run by nonprofit, independent organizations. Some are affiliated with hospitals, nursing homes, or home health care agencies. Hospice programs can take place in your home or at a hospice center, hospital, or skilled nursing facility. When choosing a hospice program, ask the following questions: °· What services are available to me? °· What services will be offered to my loved ones? °· How involved will my loved ones be? °· How involved will my health care provider be? °· Who makes up the hospice care team? How are they trained or screened? °· How will my pain and symptoms be managed? °· If my circumstances change, can the services be provided in a different setting, such as my home or in the hospital? °· Is the program reviewed and licensed by the state or certified in some other way? °· What does it cost? Is it covered by insurance? °· If I choose a hospice   center or nursing home, where is the hospice center located? Is it convenient for family and friends? °· If I choose a hospice center or nursing home, can my family and friends visit any time? °· Will you provide emotional and spiritual support? °· Who can my family call with questions? °Where can I learn more about hospice? °You can learn about existing hospice programs in your area  from your health care providers. You can also read more about hospice online. The websites of the following organizations have helpful information: °· National Hospice and Palliative Care Organization (NHPCO): www.nhpco.org °· National Association for Home Care & Hospice (NAHC): www.nahc.org °· Hospice Foundation of America (HFA): www.hospicefoundation.org °· American Cancer Society (ACS): www.cancer.org °· Hospice Net: www.hospicenet.org °· Visiting Nurse Associations of America (VNAA): www.vnaa.org °You may also find more information by contacting the following agencies: °· A local agency on aging. °· Your local United Way chapter. °· Your state's department of health or social services. °Summary °· Hospice is a service that is designed to provide people who are terminally ill and their families with medical, spiritual, and psychological support. °· Hospice aims to improve your quality of life by keeping you as comfortable as possible in the final stages of life. °· Hospice teams often include a doctor, nurse, social worker, counselor, religious leader,dietitian, therapists, and volunteers. °· Hospice care generally includes medicine for symptom management, visits from doctors and nurses, physical and occupational therapy, nutrition counseling, spiritual and emotional counseling, caregiver support, and bereavement support for grieving family members. °· Hospice programs can take place in your home or at a hospice center, hospital, or skilled nursing facility. °This information is not intended to replace advice given to you by your health care provider. Make sure you discuss any questions you have with your health care provider. °Document Released: 06/24/2003 Document Revised: 02/17/2017 Document Reviewed: 03/29/2016 °Elsevier Patient Education © 2020 Elsevier Inc. ° °

## 2018-12-31 NOTE — Telephone Encounter (Signed)
Patient discharging home with Hospice and Hospice asking if Dr Grayland Ormond will sign orders and serve as attending

## 2018-12-31 NOTE — Progress Notes (Signed)
Follow up visit made this morning and then again later when family had arrived. Patient seen lying in bed, sister Fraser Din brother and niece present. Plan at this time is still for placement of the Pleurx catheter to be managed at home by hospice. Family voiced understanding. Patient very much wants to return home. He does appear to be more confused today and having some visual hallucinations. Blood pressure has remained low over the weekend and continues today, last reading 66/49 at 7 am. DME needs discussed and patient does now requires a hospital bed, bed ordered, family aware. Patient will require EMS transport with signed out of facility DNR in place at discharge. [place] aware. Will continue to follow. Flo Shanks BSN, RN, [facility] [phone]

## 2018-12-31 NOTE — Plan of Care (Signed)
Pt is going home with hospice.  Hospital bed is in place.  Pt had pleurx cath placed and some fluid was removed.  DNR signed and comfort orders in place.  Foley was inserted for comfort. EMS called for transport.

## 2018-12-31 NOTE — TOC Transition Note (Signed)
Transition of Care [facility]) - CM/SW Discharge Note   Patient Details  Name: David Brown MRN: [id number] Date of Birth: 01/04/1961  [facility]) CM/SW Contact:  Candie Chroman, LCSW Phone Number: 12/31/2018, 3:35 PM   Clinical Narrative: Patient has orders to discharge home today. [facility] is aware. All transport paperwork is in the discharge packet. No further concerns. CSW signing off.    Final next level of care: Home w Hospice Care Barriers to Discharge: Barriers Resolved   Patient Goals and CMS Choice        Discharge Placement                Patient to be transferred to facility by: EMS Name of family member notified: Chasse Garinger Patient and family notified of of transfer: 12/31/18  Discharge Plan and Services                                     Social Determinants of Health (SDOH) Interventions     Readmission Risk Interventions Readmission Risk Prevention Plan 12/25/2018  Transportation Screening Complete  HRI or Home Care Consult Complete  Palliative Care Screening Complete  Medication Review (RN Care Manager) Complete  HRI or Home Care Consult Complete  SW Recovery Care/Counseling Consult Complete  Some recent data might be hidden

## 2018-12-31 NOTE — TOC Progression Note (Signed)
Transition of Care [facility]) - Progression Note    Patient Details  Name: David Brown MRN: [id number] Date of Birth: [birth year]  Transition of Care [facility] CM/SW [place], LCSW Phone Number: 12/31/2018, 2:43 PM  Clinical Narrative: Patient may potentially discharge today after his procedure. He will need EMS. CSW called brother and confirmed the address. He said the hospital bed was being delivered now. Transport paperwork is in his discharge packet in case he leaves late this afternoon.        Expected Discharge Plan and Services                                                 Social Determinants of Health (SDOH) Interventions    Readmission Risk Interventions Readmission Risk Prevention Plan 12/25/2018  Transportation Screening Complete  HRI or Home Care Consult Complete  Palliative Care Screening Complete  Medication Review (RN Care Manager) Complete  HRI or Home Care Consult Complete  SW Recovery Care/Counseling Consult Complete  Some recent data might be hidden

## 2019-01-01 ENCOUNTER — Telehealth: Payer: Self-pay | Admitting: *Deleted

## 2019-01-20 NOTE — Telephone Encounter (Signed)
VERBAL ORDER called to Hospice triage nurse [place]

## 2019-01-20 NOTE — Discharge Summary (Signed)
[facility] NAME: David Brown    MR#:  [id number]  DATE OF BIRTH:  [birth year]  DATE OF ADMISSION:  12/22/2018   ADMITTING PHYSICIAN: Lance Coon, MD  DATE OF DISCHARGE: 12/31/2018  6:17 PM  PRIMARY CARE PHYSICIAN: Patient, No Pcp Per   ADMISSION DIAGNOSIS:  Generalized abdominal pain [R10.84] Cirrhosis of liver with ascites, unspecified hepatic cirrhosis type ([place]) [K74.60, R18.8] Cirrhosis of liver with ascites ([place]) [K74.60, R18.8] DISCHARGE DIAGNOSIS:  Principal Problem:   Cirrhosis of liver with ascites ([place]) Active Problems:   Hepatocellular carcinoma ([place])   Hyponatremia  SECONDARY DIAGNOSIS:   Past Medical History:  Diagnosis Date  . Cancer ([place])   . Cirrhosis of liver ([place])    per note from [facility] on admission diagnosis  . Dyspnea    shortness of breath with exertion (walking to mailbox)  . 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 Patient has had refractory ascites requiring weekly large-volumeparacentesis. He was admitted to the hospital on 10/20/2018 to8/05/2018 with ascites. Patient was Also hospitalized 8/19/2020to 8/25/2020with hyponatremia.He is now readmitted10/05/2018 with hyponatremia, hyperkalemia and coffee-ground emesis.   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 He prefers to be at home with hospice. Comfort care only. DISCHARGE CONDITIONS:  Critical - actively dying CONSULTS OBTAINED:   DRUG ALLERGIES:  No Known Allergies DISCHARGE MEDICATIONS:   Allergies as of 12/31/2018   No Known Allergies     Medication List    STOP taking these medications   multivitamin with minerals Tabs tablet   spironolactone 50 MG tablet Commonly known as: Aldactone   traMADol 50 MG tablet Commonly known as: ULTRAM     TAKE these medications   Banophen 50 MG capsule Generic drug: diphenhydrAMINE Take 50 mg by mouth at bedtime.   escitalopram 10 MG tablet Commonly known as: LEXAPRO Take 1 tablet (10 mg total) by mouth daily.   glycopyrrolate 1 MG tablet Commonly known as: ROBINUL Take 1 tablet (1 mg total) by mouth every 4 (four) hours as needed (excessive secretions).   LORazepam 0.5 MG tablet Commonly known as: Ativan Take 1 tablet (0.5 mg total) by mouth every 4 (four) hours as needed for anxiety.   morphine 20 MG/ML concentrated solution Commonly known as: ROXANOL Take 0.5 mLs (10 mg total) by mouth every 2 (two) hours as needed for severe pain.        DISCHARGE INSTRUCTIONS:   DIET:  Regular diet DISCHARGE CONDITION:  Serious ACTIVITY:  Activity as tolerated OXYGEN:  Home Oxygen: No.  Oxygen Delivery: room air DISCHARGE LOCATION:  home   If you experience worsening of your admission symptoms, develop shortness of breath, life threatening emergency, suicidal or homicidal thoughts you must seek medical attention immediately by calling 911 or calling your MD immediately  if symptoms less severe.  You Must read complete instructions/literature along with all the possible adverse reactions/side effects for all the Medicines you take and that  have been prescribed to you. Take any new Medicines after you have completely understood and accpet all the possible  adverse reactions/side effects.   Please note  You were cared for by a hospitalist during your hospital stay. If you have any questions about your discharge medications or the care you received while you were in the hospital after you are discharged, you can call the unit and asked to speak with the hospitalist on call if the hospitalist that took care of you is not available. Once you are discharged, your primary care physician will handle any further medical issues. Please note that NO REFILLS for any discharge medications will be authorized once you are discharged, as it is imperative that you return to your primary care physician (or establish a relationship with a primary care physician if you do not have one) for your aftercare needs so that they can reassess your need for medications and monitor your lab values.    On the day of Discharge:  VITAL SIGNS:  Blood pressure (!) 69/49, pulse 100, temperature 98.3 F (36.8 C), temperature source Oral, resp. rate 18, height 5\' 6"  (1.676 m), weight 57.4 kg, SpO2 100 %. PHYSICAL EXAMINATION:  GENERAL:  58 y.o.-year-old patient lying in the bed with no acute distress.  EYES: Pupils equal, round, reactive to light and accommodation. No scleral icterus. Extraocular muscles intact.  HEENT: Head atraumatic, normocephalic. Oropharynx and nasopharynx clear.  NECK:  Supple, no jugular venous distention. No thyroid enlargement, no tenderness.  LUNGS: Normal breath sounds bilaterally, no wheezing, rales,rhonchi or crepitation. No use of accessory muscles of respiration.  CARDIOVASCULAR: S1, S2 normal. No murmurs, rubs, or gallops.  ABDOMEN: distended - pleurx catheter in place EXTREMITIES: No pedal edema, cyanosis, or clubbing.  NEUROLOGIC: Cranial nerves II through XII are intact. Muscle strength 5/5 in all extremities. Sensation intact. Gait not checked.  PSYCHIATRIC: The patient is alert and oriented x 3.  SKIN: No obvious rash, lesion, or ulcer.  DATA  REVIEW:   CBC Recent Labs  Lab 12/26/18 0239  WBC 14.0*  HGB 12.2*  HCT 33.1*  PLT 132*    Chemistries  Recent Labs  Lab 12/29/18 0408  NA 118*  K 5.2*  CL 91*  CO2 18*  GLUCOSE 66*  BUN 62*  CREATININE 2.67*  CALCIUM 7.9*      Management plans discussed with the patient, family and they are in agreement.  CODE STATUS: Prior   TOTAL TIME TAKING CARE OF THIS PATIENT: 45 minutes.    Max Sane M.D on Jan 28, 2019 at 8:34 PM  Between 7am to 6pm - Pager - [phone]  After 6pm go to [email or web address] - Proofreader  [facility]  Office  [phone]  CC: Primary care physician; Patient, No Pcp Per   Note: This dictation was prepared with Dragon dictation along with smaller phrase technology. Any transcriptional errors that result from this process are unintentional.

## 2019-01-20 NOTE — Telephone Encounter (Signed)
I called David Brown to inform her that Dr Grayland Ormond would sign orders, She informed me that patient expired at 7:40 this morning
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
## 2019-01-20 DEATH — deceased

## 2019-01-28 ENCOUNTER — Other Ambulatory Visit: Payer: Self-pay | Admitting: *Deleted

## 2019-03-01 ENCOUNTER — Other Ambulatory Visit: Payer: Self-pay

## 2019-03-06 ENCOUNTER — Other Ambulatory Visit: Payer: Self-pay

## 2019-03-06 ENCOUNTER — Ambulatory Visit: Payer: Self-pay | Admitting: Oncology

## 2019-03-06 ENCOUNTER — Encounter: Payer: Self-pay | Admitting: Hospice and Palliative Medicine

## 2021-03-30 IMAGING — CT NUCLEAR MEDICINE PET IMAGE INITIAL (PI) SKULL BASE TO THIGH
3 series · 30 of 30 positions shown · non-contrast
Comparison: Abdomen/pelvis CT 09/25/2018

CLINICAL DATA: Initial treatment strategy for liver mass.

EXAM:
NUCLEAR MEDICINE PET SKULL BASE TO THIGH
TECHNIQUE: 9.1 mCi F-18 FDG was injected intravenously. Full-ring PET imaging
was performed from the skull base to thigh after the radiotracer. CT
data was obtained and used for attenuation correction and anatomic
localization.
Fasting blood glucose: 88 mg/dl

[Series 3: ct wb 5.0 b30f · axial · 0.98mm/px · z∈[-1080,-213]mm · 10 of 290 slices shown]
[im 1/290  brain]
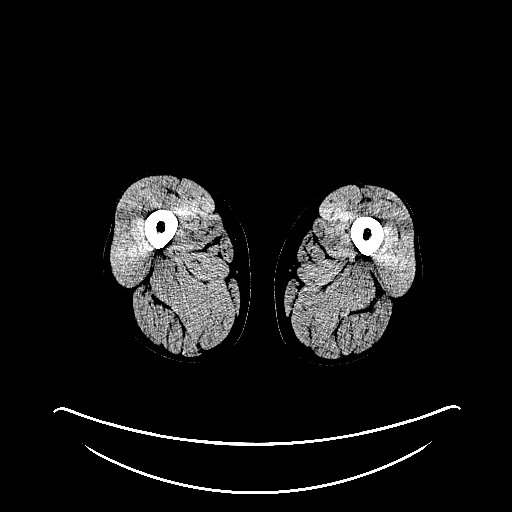
[im 33/290  brain]
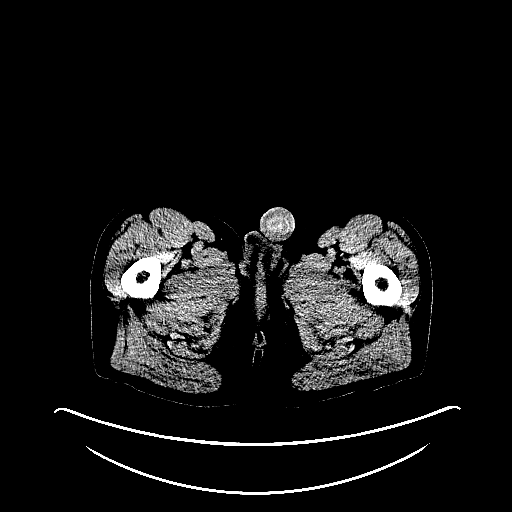
[im 65/290  brain]
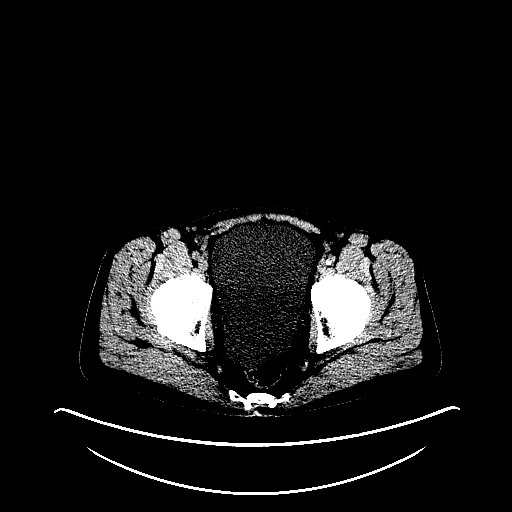
[im 97/290  brain]
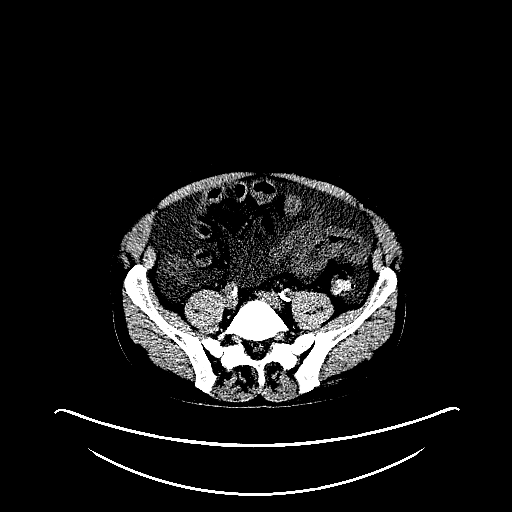
[im 129/290  brain]
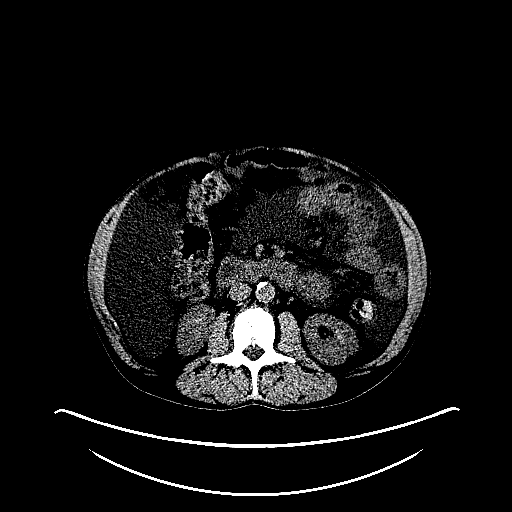
[im 161/290  brain]
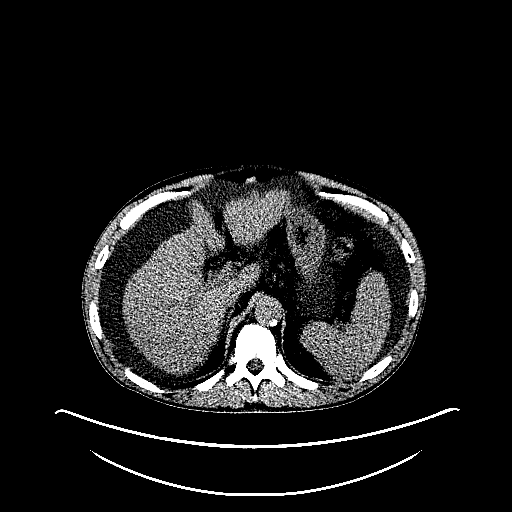
[im 193/290  brain]
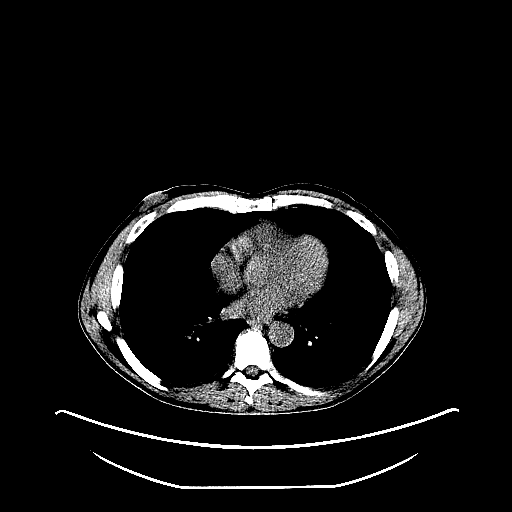
[im 225/290  brain]
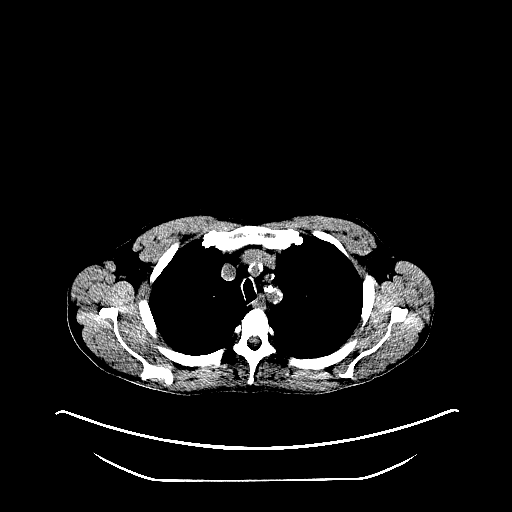
[im 257/290  brain]
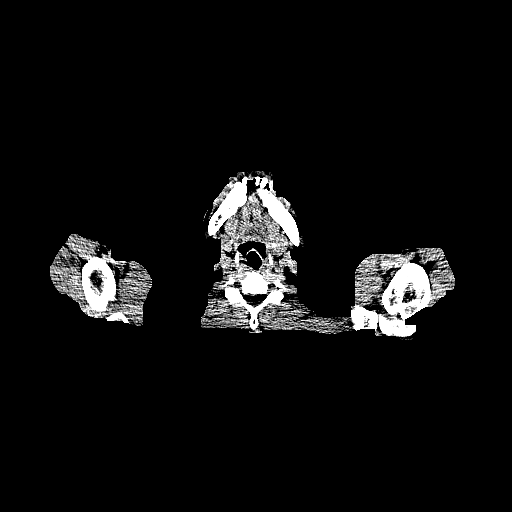
[im 290/290  brain]
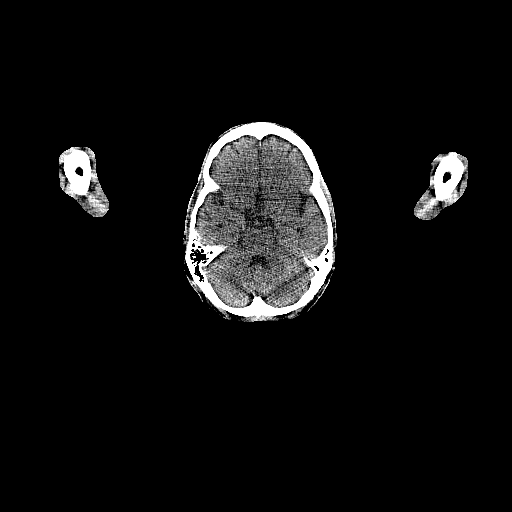

[Series 4: pet wb (ac) · axial · 5.0mm · 2.91mm/px · z∈[-1080,-213]mm · 10 of 290 slices shown]
[im 1/290]
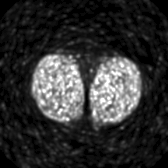
[im 33/290]
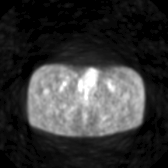
[im 65/290]
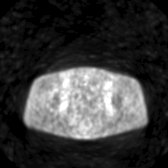
[im 97/290]
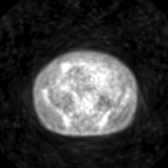
[im 129/290]
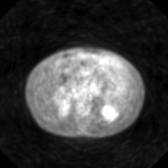
[im 161/290]
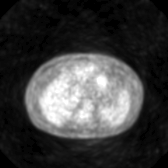
[im 193/290]
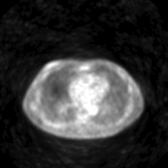
[im 225/290]
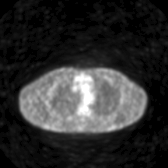
[im 257/290]
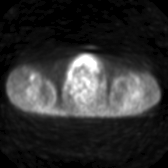
[im 290/290]
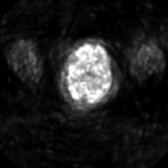

[Series 5: pet wb uncorrected (nac) · axial · 5.0mm · 4.07mm/px · z∈[-1080,-213]mm · 10 of 290 slices shown]
[im 1/290]
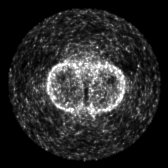
[im 33/290]
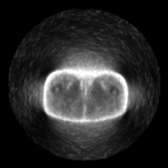
[im 65/290]
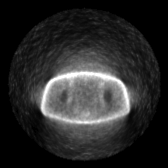
[im 97/290]
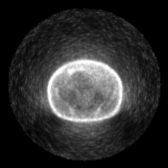
[im 129/290]
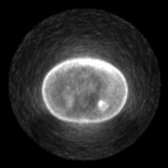
[im 161/290]
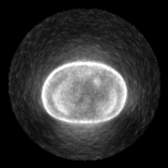
[im 193/290]
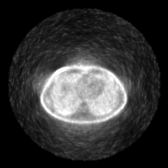
[im 225/290]
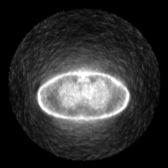
[im 257/290]
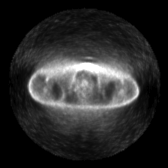
[im 290/290]
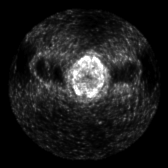

[30 of 30 positions shown; findings below may reference images not displayed]

FINDINGS: Mediastinal blood pool activity: SUV max

Liver activity: SUV max NA

NECK: Small focus of hypermetabolism in the right neck without
underlying lymphadenopathy is indeterminate.

Incidental CT findings: none

CHEST: No hypermetabolic mediastinal or hilar nodes. No suspicious
pulmonary nodules on the CT scan.

Incidental CT findings: Coronary artery calcification is evident.
Atherosclerotic calcification is noted in the wall of the thoracic
aorta. Bilateral gynecomastia.

ABDOMEN/PELVIS: No abnormal hypermetabolic activity within the
liver, pancreas, adrenal glands, or spleen. No hypermetabolic lymph
nodes in the abdomen or pelvis.

No discernible hypermetabolism associated with the right hepatic
lesion evident on prior CT.

Incidental CT findings: Nodular irregular hepatic contour compatible
with cirrhosis. Moderate to large volume ascites. There is abdominal
aortic atherosclerosis without aneurysm.

SKELETON: No focal hypermetabolic activity to suggest skeletal
metastasis.

Incidental CT findings: None.
IMPRESSION: 1. No hypermetabolism associated with the patient's suspicious right
hepatic lesion. MRI may prove helpful to further evaluate.
2. Small focus of hypermetabolism in the right neck without
underlying lymphadenopathy/etiology. Finding is indeterminate.
3. No suspicious or unexpected hypermetabolism in the chest,
abdomen, or pelvis.
4. Cirrhotic liver morphology with moderate to large volume ascites.
5.  Aortic Atherosclerois ([id number])
